# Patient Record
Sex: Female | Born: 1955 | Race: White | Hispanic: No | Marital: Married | Smoking: Never smoker
Health system: Southern US, Community
[De-identification: ages and names within clinical notes are randomized; demographics above are authoritative.]

## PROBLEM LIST (undated history)

## (undated) DIAGNOSIS — N809 Endometriosis, unspecified: Secondary | ICD-10-CM

## (undated) DIAGNOSIS — D649 Anemia, unspecified: Secondary | ICD-10-CM

## (undated) DIAGNOSIS — A048 Other specified bacterial intestinal infections: Secondary | ICD-10-CM

## (undated) DIAGNOSIS — K219 Gastro-esophageal reflux disease without esophagitis: Secondary | ICD-10-CM

## (undated) DIAGNOSIS — M1990 Unspecified osteoarthritis, unspecified site: Secondary | ICD-10-CM

## (undated) DIAGNOSIS — Z8709 Personal history of other diseases of the respiratory system: Secondary | ICD-10-CM

## (undated) DIAGNOSIS — I10 Essential (primary) hypertension: Secondary | ICD-10-CM

## (undated) DIAGNOSIS — T8859XA Other complications of anesthesia, initial encounter: Secondary | ICD-10-CM

## (undated) DIAGNOSIS — Z8489 Family history of other specified conditions: Secondary | ICD-10-CM

## (undated) DIAGNOSIS — D219 Benign neoplasm of connective and other soft tissue, unspecified: Secondary | ICD-10-CM

## (undated) DIAGNOSIS — J385 Laryngeal spasm: Secondary | ICD-10-CM

## (undated) DIAGNOSIS — T4145XA Adverse effect of unspecified anesthetic, initial encounter: Secondary | ICD-10-CM

## (undated) DIAGNOSIS — I498 Other specified cardiac arrhythmias: Secondary | ICD-10-CM

## (undated) DIAGNOSIS — R000 Tachycardia, unspecified: Secondary | ICD-10-CM

## (undated) HISTORY — PX: COLONOSCOPY: SHX174

## (undated) HISTORY — PX: OTHER SURGICAL HISTORY: SHX169

## (undated) HISTORY — PX: TONSILLECTOMY: SUR1361

## (undated) HISTORY — PX: JOINT REPLACEMENT: SHX530

---

## 1998-01-25 HISTORY — PX: COLONOSCOPY: SHX174

## 1998-01-25 HISTORY — PX: LAPAROSCOPY: SHX197

## 1998-01-25 HISTORY — PX: DILATION AND CURETTAGE OF UTERUS: SHX78

## 1998-01-25 HISTORY — PX: ESOPHAGOGASTRODUODENOSCOPY ENDOSCOPY: SHX5814

## 1998-02-10 ENCOUNTER — Emergency Department (HOSPITAL_COMMUNITY): Admission: EM | Admit: 1998-02-10 | Discharge: 1998-02-10 | Payer: Self-pay

## 2002-03-20 ENCOUNTER — Encounter: Payer: Self-pay | Admitting: Obstetrics and Gynecology

## 2002-03-20 ENCOUNTER — Encounter: Admission: RE | Admit: 2002-03-20 | Discharge: 2002-03-20 | Payer: Self-pay | Admitting: Obstetrics and Gynecology

## 2004-07-21 ENCOUNTER — Encounter: Admission: RE | Admit: 2004-07-21 | Discharge: 2004-07-21 | Payer: Self-pay | Admitting: Obstetrics and Gynecology

## 2004-12-28 ENCOUNTER — Encounter: Admission: RE | Admit: 2004-12-28 | Discharge: 2004-12-28 | Payer: Self-pay | Admitting: Obstetrics and Gynecology

## 2005-09-20 ENCOUNTER — Encounter: Admission: RE | Admit: 2005-09-20 | Discharge: 2005-09-20 | Payer: Self-pay | Admitting: *Deleted

## 2006-10-31 ENCOUNTER — Encounter: Admission: RE | Admit: 2006-10-31 | Discharge: 2006-10-31 | Payer: Self-pay | Admitting: *Deleted

## 2008-01-26 DIAGNOSIS — J385 Laryngeal spasm: Secondary | ICD-10-CM

## 2008-01-26 DIAGNOSIS — D649 Anemia, unspecified: Secondary | ICD-10-CM

## 2008-01-26 HISTORY — DX: Laryngeal spasm: J38.5

## 2008-01-26 HISTORY — DX: Anemia, unspecified: D64.9

## 2008-03-15 ENCOUNTER — Encounter: Admission: RE | Admit: 2008-03-15 | Discharge: 2008-03-15 | Payer: Self-pay | Admitting: Unknown Physician Specialty

## 2009-04-04 ENCOUNTER — Encounter: Admission: RE | Admit: 2009-04-04 | Discharge: 2009-04-04 | Payer: Self-pay | Admitting: Family Medicine

## 2010-07-23 ENCOUNTER — Other Ambulatory Visit: Payer: Self-pay | Admitting: Family Medicine

## 2010-07-23 ENCOUNTER — Ambulatory Visit
Admission: RE | Admit: 2010-07-23 | Discharge: 2010-07-23 | Disposition: A | Discharge status: Home / Self Care | Payer: PRIVATE HEALTH INSURANCE | Source: Ambulatory Visit | Attending: Family Medicine | Admitting: Family Medicine

## 2010-07-23 DIAGNOSIS — Z1231 Encounter for screening mammogram for malignant neoplasm of breast: Secondary | ICD-10-CM

## 2011-01-26 HISTORY — PX: BACK SURGERY: SHX140

## 2012-04-28 ENCOUNTER — Other Ambulatory Visit: Payer: Self-pay

## 2012-04-28 DIAGNOSIS — Z1231 Encounter for screening mammogram for malignant neoplasm of breast: Secondary | ICD-10-CM

## 2012-05-24 ENCOUNTER — Ambulatory Visit
Admission: RE | Admit: 2012-05-24 | Discharge: 2012-05-24 | Disposition: A | Discharge status: Home / Self Care | Payer: PRIVATE HEALTH INSURANCE | Source: Ambulatory Visit

## 2012-05-24 DIAGNOSIS — Z1231 Encounter for screening mammogram for malignant neoplasm of breast: Secondary | ICD-10-CM

## 2012-05-26 ENCOUNTER — Ambulatory Visit: Payer: PRIVATE HEALTH INSURANCE

## 2012-07-03 ENCOUNTER — Encounter (INDEPENDENT_AMBULATORY_CARE_PROVIDER_SITE_OTHER): Payer: Self-pay | Admitting: *Deleted

## 2013-07-06 ENCOUNTER — Other Ambulatory Visit: Payer: Self-pay

## 2013-07-06 DIAGNOSIS — Z1231 Encounter for screening mammogram for malignant neoplasm of breast: Secondary | ICD-10-CM

## 2013-07-20 ENCOUNTER — Ambulatory Visit
Admission: RE | Admit: 2013-07-20 | Discharge: 2013-07-20 | Disposition: A | Discharge status: Home / Self Care | Payer: PRIVATE HEALTH INSURANCE | Source: Ambulatory Visit

## 2013-07-20 ENCOUNTER — Encounter (INDEPENDENT_AMBULATORY_CARE_PROVIDER_SITE_OTHER): Payer: Self-pay

## 2013-07-20 DIAGNOSIS — Z1231 Encounter for screening mammogram for malignant neoplasm of breast: Secondary | ICD-10-CM

## 2013-07-23 ENCOUNTER — Other Ambulatory Visit: Payer: Self-pay | Admitting: Family Medicine

## 2013-07-23 DIAGNOSIS — R928 Other abnormal and inconclusive findings on diagnostic imaging of breast: Secondary | ICD-10-CM

## 2013-07-24 ENCOUNTER — Other Ambulatory Visit: Payer: Self-pay | Admitting: Family Medicine

## 2013-07-24 DIAGNOSIS — R928 Other abnormal and inconclusive findings on diagnostic imaging of breast: Secondary | ICD-10-CM

## 2013-07-25 HISTORY — PX: BREAST BIOPSY: SHX20

## 2013-07-26 ENCOUNTER — Ambulatory Visit
Admission: RE | Admit: 2013-07-26 | Discharge: 2013-07-26 | Disposition: A | Discharge status: Home / Self Care | Payer: PRIVATE HEALTH INSURANCE | Source: Ambulatory Visit | Attending: Family Medicine | Admitting: Family Medicine

## 2013-07-26 ENCOUNTER — Other Ambulatory Visit: Payer: Self-pay | Admitting: Family Medicine

## 2013-07-26 DIAGNOSIS — N6002 Solitary cyst of left breast: Secondary | ICD-10-CM

## 2013-07-26 DIAGNOSIS — R928 Other abnormal and inconclusive findings on diagnostic imaging of breast: Secondary | ICD-10-CM

## 2013-07-31 ENCOUNTER — Ambulatory Visit
Admission: RE | Admit: 2013-07-31 | Discharge: 2013-07-31 | Disposition: A | Discharge status: Home / Self Care | Payer: PRIVATE HEALTH INSURANCE | Source: Ambulatory Visit | Attending: Family Medicine | Admitting: Family Medicine

## 2013-07-31 ENCOUNTER — Other Ambulatory Visit: Payer: Self-pay | Admitting: Family Medicine

## 2013-07-31 DIAGNOSIS — N6002 Solitary cyst of left breast: Secondary | ICD-10-CM

## 2013-09-27 ENCOUNTER — Other Ambulatory Visit: Payer: Self-pay | Admitting: Orthopedic Surgery

## 2013-09-27 NOTE — Progress Notes (Signed)
Preoperative surgical orders have been place into the [facility] hospital system for Janann August on 09/27/2013, 12:50 PM  by Mickel Crow for surgery on 10/24/2013.  Preop Total Hip - Anterior Approach orders including Experel Injecion, IV Tylenol, and IV Decadron as long as there are no contraindications to the above medications. Arlee Muslim, PA-C

## 2013-10-16 ENCOUNTER — Encounter (HOSPITAL_COMMUNITY): Payer: Self-pay | Admitting: Pharmacy Technician

## 2013-10-17 ENCOUNTER — Other Ambulatory Visit: Payer: Self-pay | Admitting: Surgical

## 2013-10-17 NOTE — H&P (Signed)
TOTAL HIP ADMISSION H&P  Patient is admitted for left total hip arthroplasty.  Subjective:  Chief Complaint: left hip pain  HPI: Barbara Kirby, 58 y.o. female, has a history of pain and functional disability in the left hip(s) due to arthritis and patient has failed non-surgical conservative treatments for greater than 12 weeks to include NSAID's and/or analgesics, flexibility and strengthening excercises and activity modification.  Onset of symptoms was gradual starting 3 years ago with gradually worsening course since that time.The patient noted no past surgery on the left hip(s).  Patient currently rates pain in the left hip at 9 out of 10 with activity. Patient has night pain, worsening of pain with activity and weight bearing, pain that interfers with activities of daily living, pain with passive range of motion and crepitus. Patient has evidence of subchondral cysts, periarticular osteophytes and joint space narrowing by imaging studies. This condition presents safety issues increasing the risk of falls.  There is no current active infection.  Past Medical History Vertigo Bronchitis GERD Palpitations Degenerative Disc Disease Mumps (childhood) Menopause Osteoarthritis Chronic Pain Hypertension  Past Surgical History Dilation and Curettage of Uterus - Multiple Spinal Fusion. lower back Spinal Surgery Tonsillectomy  Current outpatient prescriptions: acetaminophen (TYLENOL) 325 MG tablet, Take 650 mg by mouth once as needed for moderate pain., Disp: , Rfl: ;   amoxicillin (AMOXIL) 500 MG capsule, Take 500 mg by mouth 4 (four) times daily., Disp: , Rfl: ;   aspirin EC 81 MG tablet, Take 81 mg by mouth at bedtime., Disp: , Rfl: ;   clarithromycin (BIAXIN) 500 MG tablet, Take 500 mg by mouth 2 (two) times daily., Disp: , Rfl:  escitalopram (LEXAPRO) 10 MG tablet, Take 5 mg by mouth at bedtime., Disp: , Rfl: ;   esomeprazole (NEXIUM) 20 MG capsule, Take 20 mg by mouth at  bedtime., Disp: , Rfl: ;   gabapentin (NEURONTIN) 300 MG capsule, Take 300 mg by mouth at bedtime., Disp: , Rfl: ;   metoprolol succinate (TOPROL-XL) 50 MG 24 hr tablet, Take 50 mg by mouth at bedtime. Take with or immediately following a meal., Disp: , Rfl:  Naproxen-Esomeprazole (VIMOVO PO), Take 1 tablet by mouth daily., Disp: , Rfl: ;   triamterene-hydrochlorothiazide (MAXZIDE-25) 37.5-25 MG per tablet, Take 0.5 tablets by mouth at bedtime., Disp: , Rfl:   Allergies  Allergen Reactions  . Augmentin [Amoxicillin-Pot Clavulanate] Nausea And Vomiting  . Codeine Nausea Only    History  Substance Use Topics  . Smoking status: None smoker  . Smokeless tobacco: No  . Alcohol Use: None      Review of Systems  Constitutional: Positive for diaphoresis. Negative for fever, chills, weight loss and malaise/fatigue.  HENT: Negative.   Eyes: Negative.   Respiratory: Negative.   Cardiovascular: Positive for palpitations. Negative for chest pain, orthopnea, claudication, leg swelling and PND.  Gastrointestinal: Negative.   Genitourinary: Negative.   Musculoskeletal: Positive for back pain and joint pain. Negative for falls, myalgias and neck pain.       Left hip pain  Skin: Negative.   Neurological: Negative.  Negative for weakness.  Endo/Heme/Allergies: Negative.   Psychiatric/Behavioral: Negative.     Objective:  Physical Exam  Constitutional: She is oriented to person, place, and time. She appears well-developed. No distress.  Obese  HENT:  Head: Normocephalic and atraumatic.  Right Ear: External ear normal.  Left Ear: External ear normal.  Nose: Nose normal.  Mouth/Throat: Oropharynx is clear and moist.  Eyes: Conjunctivae  and EOM are normal.  Neck: Normal range of motion. Neck supple.  Cardiovascular: Normal rate, regular rhythm, normal heart sounds and intact distal pulses.   No murmur heard. Respiratory: Effort normal and breath sounds normal. No respiratory distress. She  has no wheezes.  GI: Soft. Bowel sounds are normal. She exhibits no distension. There is no tenderness.  Musculoskeletal:       Right hip: Normal.       Left hip: She exhibits decreased range of motion, decreased strength and crepitus.       Right knee: Normal.       Left knee: Normal.       Right lower leg: She exhibits no tenderness and no swelling.       Left lower leg: She exhibits no tenderness and no swelling.  Her left hip can be flexed to approximately 90 with no internal rotation, about 10-20 of external rotation and about 20 abduction. Right hip has near normal range of motion without discomfort. She walks with a significantly antalgic gait pattern. She has pain on attempted provocative testing of the hip.  Neurological: She is alert and oriented to person, place, and time. She has normal strength and normal reflexes. No sensory deficit.  Skin: No rash noted. She is not diaphoretic. No erythema.  Psychiatric: She has a normal mood and affect. Her behavior is normal.   Vitals  Weight: 240 lb Height: 68in Body Surface Area: 2.29 m Body Mass Index: 36.49 kg/m Pulse: 72 (Regular)  BP: 148/82 (Sitting, Left Arm, Standard)  Imaging Review Plain radiographs demonstrate severe degenerative joint disease of the left hip(s). The bone quality appears to be good for age and reported activity level.  Assessment/Plan:  End stage arthritis, left hip(s)  The patient history, physical examination, clinical judgement of the provider and imaging studies are consistent with end stage degenerative joint disease of the left hip(s) and total hip arthroplasty is deemed medically necessary. The treatment options including medical management, injection therapy, arthroscopy and arthroplasty were discussed at length. The risks and benefits of total hip arthroplasty were presented and reviewed. The risks due to aseptic loosening, infection, stiffness, dislocation/subluxation,  thromboembolic  complications and other imponderables were discussed.  The patient acknowledged the explanation, agreed to proceed with the plan and consent was signed. Patient is being admitted for inpatient treatment for surgery, pain control, PT, OT, prophylactic antibiotics, VTE prophylaxis, progressive ambulation and ADL's and discharge planning.The patient is planning to be discharged home with home health services    TXA  WNI:OEVOJ Daniel Spine: Mark Roy     , [place]

## 2013-10-19 ENCOUNTER — Encounter (HOSPITAL_COMMUNITY): Payer: Self-pay

## 2013-10-19 ENCOUNTER — Encounter (INDEPENDENT_AMBULATORY_CARE_PROVIDER_SITE_OTHER): Payer: Self-pay

## 2013-10-19 ENCOUNTER — Ambulatory Visit (HOSPITAL_COMMUNITY)
Admission: RE | Admit: 2013-10-19 | Discharge: 2013-10-19 | Disposition: A | Discharge status: Home / Self Care | Payer: PRIVATE HEALTH INSURANCE | Source: Ambulatory Visit | Attending: Orthopedic Surgery | Admitting: Orthopedic Surgery

## 2013-10-19 ENCOUNTER — Encounter (HOSPITAL_COMMUNITY)
Admission: RE | Admit: 2013-10-19 | Discharge: 2013-10-19 | Disposition: A | Discharge status: Home / Self Care | Payer: PRIVATE HEALTH INSURANCE | Source: Ambulatory Visit | Attending: Orthopedic Surgery | Admitting: Orthopedic Surgery

## 2013-10-19 ENCOUNTER — Ambulatory Visit (HOSPITAL_COMMUNITY)
Admission: RE | Admit: 2013-10-19 | Discharge: 2013-10-19 | Disposition: A | Discharge status: Home / Self Care | Payer: PRIVATE HEALTH INSURANCE | Source: Ambulatory Visit | Attending: Anesthesiology | Admitting: Anesthesiology

## 2013-10-19 DIAGNOSIS — M1610 Unilateral primary osteoarthritis, unspecified hip: Secondary | ICD-10-CM | POA: Diagnosis present

## 2013-10-19 DIAGNOSIS — Z01818 Encounter for other preprocedural examination: Secondary | ICD-10-CM | POA: Diagnosis present

## 2013-10-19 DIAGNOSIS — M169 Osteoarthritis of hip, unspecified: Secondary | ICD-10-CM | POA: Diagnosis present

## 2013-10-19 HISTORY — DX: Gastro-esophageal reflux disease without esophagitis: K21.9

## 2013-10-19 HISTORY — DX: Unspecified osteoarthritis, unspecified site: M19.90

## 2013-10-19 HISTORY — DX: Other specified bacterial intestinal infections: A04.8

## 2013-10-19 HISTORY — DX: Adverse effect of unspecified anesthetic, initial encounter: T41.45XA

## 2013-10-19 HISTORY — DX: Other specified cardiac arrhythmias: I49.8

## 2013-10-19 HISTORY — DX: Essential (primary) hypertension: I10

## 2013-10-19 HISTORY — DX: Endometriosis, unspecified: N80.9

## 2013-10-19 HISTORY — DX: Other complications of anesthesia, initial encounter: T88.59XA

## 2013-10-19 HISTORY — DX: Tachycardia, unspecified: R00.0

## 2013-10-19 HISTORY — DX: Laryngeal spasm: J38.5

## 2013-10-19 HISTORY — DX: Benign neoplasm of connective and other soft tissue, unspecified: D21.9

## 2013-10-19 HISTORY — DX: Anemia, unspecified: D64.9

## 2013-10-19 HISTORY — DX: Personal history of other diseases of the respiratory system: Z87.09

## 2013-10-19 LAB — COMPREHENSIVE METABOLIC PANEL
ALBUMIN: 4 g/dL (ref 3.5–5.2)
ALT: 21 U/L (ref 0–35)
AST: 21 U/L (ref 0–37)
Alkaline Phosphatase: 48 U/L (ref 39–117)
Anion gap: 11 (ref 5–15)
BILIRUBIN TOTAL: 0.5 mg/dL (ref 0.3–1.2)
BUN: 15 mg/dL (ref 6–23)
CHLORIDE: 99 meq/L (ref 96–112)
CO2: 28 meq/L (ref 19–32)
Calcium: 9.2 mg/dL (ref 8.4–10.5)
Creatinine, Ser: 0.74 mg/dL (ref 0.50–1.10)
GFR calc Af Amer: >90 mL/min (ref >90)
Glucose, Bld: 101 mg/dL — ABNORMAL HIGH (ref 70–99)
POTASSIUM: 4.5 meq/L (ref 3.7–5.3)
Sodium: 138 mEq/L (ref 137–147)
Total Protein: 7.6 g/dL (ref 6.0–8.3)

## 2013-10-19 LAB — URINALYSIS, ROUTINE W REFLEX MICROSCOPIC
Bilirubin Urine: NEGATIVE
Glucose, UA: NEGATIVE mg/dL
Hgb urine dipstick: NEGATIVE
Ketones, ur: NEGATIVE mg/dL
Leukocytes, UA: NEGATIVE
NITRITE: NEGATIVE
PH: 6 (ref 5.0–8.0)
Protein, ur: NEGATIVE mg/dL
SPECIFIC GRAVITY, URINE: 1.025 (ref 1.005–1.030)
Urobilinogen, UA: 0.2 mg/dL (ref 0.0–1.0)

## 2013-10-19 LAB — CBC
HCT: 40 % (ref 36.0–46.0)
Hemoglobin: 14.2 g/dL (ref 12.0–15.0)
MCH: 32.6 pg (ref 26.0–34.0)
MCHC: 35.5 g/dL (ref 30.0–36.0)
MCV: 91.7 fL (ref 78.0–100.0)
PLATELETS: 223 10*3/uL (ref 150–400)
RBC: 4.36 MIL/uL (ref 3.87–5.11)
RDW: 12.7 % (ref 11.5–15.5)
WBC: 6.4 10*3/uL (ref 4.0–10.5)

## 2013-10-19 LAB — SURGICAL PCR SCREEN
MRSA, PCR: NEGATIVE
Staphylococcus aureus: NEGATIVE

## 2013-10-19 LAB — PROTIME-INR
INR: 1.03 (ref 0.00–1.49)
Prothrombin Time: 13.5 seconds (ref 11.6–15.2)

## 2013-10-19 LAB — APTT: APTT: 29 s (ref 24–37)

## 2013-10-19 LAB — ABO/RH: ABO/RH(D): A POS

## 2013-10-19 NOTE — Progress Notes (Signed)
Surgery clearance note 04/12/13 Dr. Quillian Quince on chart, serum lipase results 10/04/13 on chart, H. Pylori results 10/04/13 on chart

## 2013-10-19 NOTE — Patient Instructions (Signed)
21 Barbara Kirby  10/19/2013   Your procedure is scheduled on: 10/24/13 Wednesday  Report to [facility] at 12:15 PM.  Call this number if you have problems the morning of surgery [phone]: [phone]   Remember:   Do not eat food After Midnight, clear liquids from midnight until 08:15am on 10/24/13 then nothing.     Do not wear jewelry, make-up or nail polish.  Do not wear lotions, powders, or perfumes. You may wear deodorant.  Do not shave 48 hours prior to surgery. Men may shave face and neck.  Do not bring valuables to the hospital.  Contacts, dentures or bridgework may not be worn into surgery.  Leave suitcase in the car. After surgery it may be brought to your room.  For patients admitted to the hospital, checkout time is 11:00 AM the day of discharge.    Please read over the following fact sheets that you were given:MRSA Enlow, RN  pre op nurse call if needed [phone]    [facility] - Preparing for Surgery Before surgery, you can play an important role.  Because skin is not sterile, your skin needs to be as free of germs as possible.  You can reduce the number of germs on your skin by washing with CHG (chlorahexidine gluconate) soap before surgery.  CHG is an antiseptic cleaner which kills germs and bonds with the skin to continue killing germs even after washing. Please DO NOT use if you have an allergy to CHG or antibacterial soaps.  If your skin becomes reddened/irritated stop using the CHG and inform your nurse when you arrive at Short Stay. Do not shave (including legs and underarms) for at least 48 hours prior to the first CHG shower.  You may shave your face/neck. Please follow these instructions carefully:  1.  Shower with CHG Soap the night before surgery and the  morning of Surgery.  2.  If you choose to wash your hair, wash your hair first as usual with your  normal  shampoo.  3.  After you shampoo, rinse your hair and body  thoroughly to remove the  shampoo.                            4.  Use CHG as you would any other liquid soap.  You can apply chg directly  to the skin and wash                       Gently with a scrungie or clean washcloth.  5.  Apply the CHG Soap to your body ONLY FROM THE NECK DOWN.   Do not use on face/ open                           Wound or open sores. Avoid contact with eyes, ears mouth and genitals (private parts).                       Wash face,  Genitals (private parts) with your normal soap.             6.  Wash thoroughly, paying special attention to the area where your surgery  will be performed.  7.  Thoroughly rinse your body with warm water from the neck down.  8.  DO NOT shower/wash with your normal soap after  using and rinsing off  the CHG Soap.                9.  Pat yourself dry with a clean towel.            10.  Wear clean pajamas.            11.  Place clean sheets on your bed the night of your first shower and do not  sleep with pets. Day of Surgery : Do not apply any lotions the morning of surgery.  Please wear clean clothes to the hospital/surgery center.  FAILURE TO FOLLOW THESE INSTRUCTIONS MAY RESULT IN THE CANCELLATION OF YOUR SURGERY PATIENT SIGNATURE_________________________________  NURSE SIGNATURE__________________________________  ________________________________________________________________________  WHAT IS A BLOOD TRANSFUSION? Blood Transfusion Information  A transfusion is the replacement of blood or some of its parts. Blood is made up of multiple cells which provide different functions.  Red blood cells carry oxygen and are used for blood loss replacement.  White blood cells fight against infection.  Platelets control bleeding.  Plasma helps clot blood.  Other blood products are available for specialized needs, such as hemophilia or other clotting disorders. BEFORE THE TRANSFUSION  Who gives blood for transfusions?   Healthy volunteers  who are fully evaluated to make sure their blood is safe. This is blood bank blood. Transfusion therapy is the safest it has ever been in the practice of medicine. Before blood is taken from a donor, a complete history is taken to make sure that person has no history of diseases nor engages in risky social behavior (examples are intravenous drug use or sexual activity with multiple partners). The donor's travel history is screened to minimize risk of transmitting infections, such as malaria. The donated blood is tested for signs of infectious diseases, such as HIV and hepatitis. The blood is then tested to be sure it is compatible with you in order to minimize the chance of a transfusion reaction. If you or a relative donates blood, this is often done in anticipation of surgery and is not appropriate for emergency situations. It takes many days to process the donated blood. RISKS AND COMPLICATIONS Although transfusion therapy is very safe and saves many lives, the main dangers of transfusion include:   Getting an infectious disease.  Developing a transfusion reaction. This is an allergic reaction to something in the blood you were given. Every precaution is taken to prevent this. The decision to have a blood transfusion has been considered carefully by your caregiver before blood is given. Blood is not given unless the benefits outweigh the risks. AFTER THE TRANSFUSION  Right after receiving a blood transfusion, you will usually feel much better and more energetic. This is especially true if your red blood cells have gotten low (anemic). The transfusion raises the level of the red blood cells which carry oxygen, and this usually causes an energy increase.  The nurse administering the transfusion will monitor you carefully for complications. HOME CARE INSTRUCTIONS  No special instructions are needed after a transfusion. You may find your energy is better. Speak with your caregiver about any limitations  on activity for underlying diseases you may have. SEEK MEDICAL CARE IF:   Your condition is not improving after your transfusion.  You develop redness or irritation at the intravenous (IV) site. SEEK IMMEDIATE MEDICAL CARE IF:  Any of the following symptoms occur over the next 12 hours:  Shaking chills.  You have a temperature by mouth above 102 F (  38.9 C), not controlled by medicine.  Chest, back, or muscle pain.  People around you feel you are not acting correctly or are confused.  Shortness of breath or difficulty breathing.  Dizziness and fainting.  You get a rash or develop hives.  You have a decrease in urine output.  Your urine turns a dark color or changes to pink, red, or brown. Any of the following symptoms occur over the next 10 days:  You have a temperature by mouth above 102 F (38.9 C), not controlled by medicine.  Shortness of breath.  Weakness after normal activity.  The white part of the eye turns yellow (jaundice).  You have a decrease in the amount of urine or are urinating less often.  Your urine turns a dark color or changes to pink, red, or brown. Document Released: 01/09/2000 Document Revised: 04/05/2011 Document Reviewed: 08/28/2007 [employer] Patient Information 2014 [employer], [place].  _______________________________________________________________________   CLEAR LIQUID DIET   Foods Allowed                                                                     Foods Excluded  Coffee and tea, regular and decaf                             liquids that you cannot  Plain Jell-O in any flavor                                             see through such as: Fruit ices (not with fruit pulp)                                     milk, soups, orange juice  Iced Popsicles                                    All solid food Carbonated beverages, regular and diet                                    Cranberry, grape and apple juices Sports drinks like  Gatorade Lightly seasoned clear broth or consume(fat free) Sugar, honey syrup  Sample Menu Breakfast                                Lunch                                     Supper Cranberry juice                    Beef broth  Chicken broth Jell-O                                     Grape juice                           Apple juice Coffee or tea                        Jell-O                                      Popsicle                                                Coffee or tea                        Coffee or tea  _____________________________________________________________________    Incentive Spirometer  An incentive spirometer is a tool that can help keep your lungs clear and active. This tool measures how well you are filling your lungs with each breath. Taking long deep breaths may help reverse or decrease the chance of developing breathing (pulmonary) problems (especially infection) following:  A long period of time when you are unable to move or be active. BEFORE THE PROCEDURE   If the spirometer includes an indicator to show your best effort, your nurse or respiratory therapist will set it to a desired goal.  If possible, sit up straight or lean slightly forward. Try not to slouch.  Hold the incentive spirometer in an upright position. INSTRUCTIONS FOR USE  1. Sit on the edge of your bed if possible, or sit up as far as you can in bed or on a chair. 2. Hold the incentive spirometer in an upright position. 3. Breathe out normally. 4. Place the mouthpiece in your mouth and seal your lips tightly around it. 5. Breathe in slowly and as deeply as possible, raising the piston or the ball toward the top of the column. 6. Hold your breath for 3-5 seconds or for as long as possible. Allow the piston or ball to fall to the bottom of the column. 7. Remove the mouthpiece from your mouth and breathe out normally. 8. Rest for a few seconds and repeat Steps  1 through 7 at least 10 times every 1-2 hours when you are awake. Take your time and take a few normal breaths between deep breaths. 9. The spirometer may include an indicator to show your best effort. Use the indicator as a goal to work toward during each repetition. 10. After each set of 10 deep breaths, practice coughing to be sure your lungs are clear. If you have an incision (the cut made at the time of surgery), support your incision when coughing by placing a pillow or rolled up towels firmly against it. Once you are able to get out of bed, walk around indoors and cough well. You may stop using the incentive spirometer when instructed by your caregiver.  RISKS AND COMPLICATIONS  Take your time so you do not get dizzy or light-headed.  If you are in pain, you  may need to take or ask for pain medication before doing incentive spirometry. It is harder to take a deep breath if you are having pain. AFTER USE  Rest and breathe slowly and easily.  It can be helpful to keep track of a log of your progress. Your caregiver can provide you with a simple table to help with this. If you are using the spirometer at home, follow these instructions: [place] IF:   You are having difficultly using the spirometer.  You have trouble using the spirometer as often as instructed.  Your pain medication is not giving enough relief while using the spirometer.  You develop fever of 100.5 F (38.1 C) or higher. SEEK IMMEDIATE MEDICAL CARE IF:   You cough up bloody sputum that had not been present before.  You develop fever of 102 F (38.9 C) or greater.  You develop worsening pain at or near the incision site. MAKE SURE YOU:   Understand these instructions.  Will watch your condition.  Will get help right away if you are not doing well or get worse. Document Released: 05/24/2006 Document Revised: 04/05/2011 Document Reviewed: 07/25/2006 [facility] Patient Information 2014 [place],  [place].   ________________________________________________________________________

## 2013-10-19 NOTE — Progress Notes (Signed)
10/19/13 0927  OBSTRUCTIVE SLEEP APNEA  Have you ever been diagnosed with sleep apnea through a sleep study? No  Do you snore loudly (loud enough to be heard through closed doors)?  0  Do you often feel tired, fatigued, or sleepy during the daytime? 0  Has anyone observed you stop breathing during your sleep? 0  Do you have, or are you being treated for high blood pressure? 1  BMI more than 35 kg/m2? 1  Age over 57 years old? 1  Neck circumference greater than 40 cm/16 inches? 1  Gender: 0  Obstructive Sleep Apnea Score 4  Score 4 or greater  Results sent to PCP

## 2013-10-22 NOTE — Progress Notes (Signed)
EKG reviewed by Dr. Kalman Shan and Dr. Marcell Barlow - no action required - also reviewed by Dr. Landry Dyke

## 2013-10-24 ENCOUNTER — Inpatient Hospital Stay (HOSPITAL_COMMUNITY): Payer: PRIVATE HEALTH INSURANCE

## 2013-10-24 ENCOUNTER — Inpatient Hospital Stay (HOSPITAL_COMMUNITY): Payer: PRIVATE HEALTH INSURANCE | Admitting: Anesthesiology

## 2013-10-24 ENCOUNTER — Encounter (HOSPITAL_COMMUNITY): Payer: Self-pay | Admitting: *Deleted

## 2013-10-24 ENCOUNTER — Encounter (HOSPITAL_COMMUNITY): Payer: PRIVATE HEALTH INSURANCE | Admitting: Anesthesiology

## 2013-10-24 ENCOUNTER — Inpatient Hospital Stay (HOSPITAL_COMMUNITY)
Admission: RE | Admit: 2013-10-24 | Discharge: 2013-10-26 | DRG: 470 | Disposition: A | Discharge status: Home Health | Payer: PRIVATE HEALTH INSURANCE | Source: Ambulatory Visit | Attending: Orthopedic Surgery | Admitting: Orthopedic Surgery

## 2013-10-24 ENCOUNTER — Encounter (HOSPITAL_COMMUNITY)
Admission: RE | Disposition: A | Discharge status: Home Health | Payer: Self-pay | Source: Ambulatory Visit | Attending: Orthopedic Surgery

## 2013-10-24 DIAGNOSIS — Z791 Long term (current) use of non-steroidal anti-inflammatories (NSAID): Secondary | ICD-10-CM | POA: Diagnosis not present

## 2013-10-24 DIAGNOSIS — Z7982 Long term (current) use of aspirin: Secondary | ICD-10-CM | POA: Diagnosis not present

## 2013-10-24 DIAGNOSIS — Z981 Arthrodesis status: Secondary | ICD-10-CM

## 2013-10-24 DIAGNOSIS — I10 Essential (primary) hypertension: Secondary | ICD-10-CM | POA: Diagnosis present

## 2013-10-24 DIAGNOSIS — Z881 Allergy status to other antibiotic agents status: Secondary | ICD-10-CM

## 2013-10-24 DIAGNOSIS — Z792 Long term (current) use of antibiotics: Secondary | ICD-10-CM

## 2013-10-24 DIAGNOSIS — G8929 Other chronic pain: Secondary | ICD-10-CM | POA: Diagnosis present

## 2013-10-24 DIAGNOSIS — Z6836 Body mass index (BMI) 36.0-36.9, adult: Secondary | ICD-10-CM

## 2013-10-24 DIAGNOSIS — E669 Obesity, unspecified: Secondary | ICD-10-CM | POA: Diagnosis present

## 2013-10-24 DIAGNOSIS — M1612 Unilateral primary osteoarthritis, left hip: Principal | ICD-10-CM | POA: Diagnosis present

## 2013-10-24 DIAGNOSIS — K219 Gastro-esophageal reflux disease without esophagitis: Secondary | ICD-10-CM | POA: Diagnosis present

## 2013-10-24 DIAGNOSIS — M169 Osteoarthritis of hip, unspecified: Secondary | ICD-10-CM | POA: Diagnosis present

## 2013-10-24 DIAGNOSIS — Z885 Allergy status to narcotic agent status: Secondary | ICD-10-CM

## 2013-10-24 DIAGNOSIS — Z79899 Other long term (current) drug therapy: Secondary | ICD-10-CM

## 2013-10-24 DIAGNOSIS — M25552 Pain in left hip: Secondary | ICD-10-CM | POA: Diagnosis present

## 2013-10-24 HISTORY — PX: TOTAL HIP ARTHROPLASTY: SHX124

## 2013-10-24 LAB — TYPE AND SCREEN
ABO/RH(D): A POS
Antibody Screen: NEGATIVE

## 2013-10-24 SURGERY — ARTHROPLASTY, HIP, TOTAL, ANTERIOR APPROACH
Anesthesia: Spinal | Site: Hip | Laterality: Left

## 2013-10-24 MED ORDER — ACETAMINOPHEN 10 MG/ML IV SOLN
1000.0000 mg | Freq: Once | INTRAVENOUS | Status: AC
  Administered 2013-10-24: 1000 mg via INTRAVENOUS
  Filled 2013-10-24: qty 100

## 2013-10-24 MED ORDER — METHOCARBAMOL 1000 MG/10ML IJ SOLN
500.0000 mg | Freq: Four times a day (QID) | INTRAVENOUS | Status: DC | PRN
  Administered 2013-10-24: 500 mg via INTRAVENOUS
  Filled 2013-10-24: qty 5

## 2013-10-24 MED ORDER — DEXAMETHASONE SODIUM PHOSPHATE 10 MG/ML IJ SOLN
10.0000 mg | Freq: Once | INTRAMUSCULAR | Status: DC
Start: 2013-10-24 — End: 2013-10-24

## 2013-10-24 MED ORDER — FENTANYL CITRATE 0.05 MG/ML IJ SOLN
INTRAMUSCULAR | Status: DC | PRN
  Administered 2013-10-24: 50 ug via INTRAVENOUS
  Administered 2013-10-24: 100 ug via INTRAVENOUS
  Administered 2013-10-24: 50 ug via INTRAVENOUS

## 2013-10-24 MED ORDER — CEFAZOLIN SODIUM-DEXTROSE 2-3 GM-% IV SOLR
2.0000 g | Freq: Four times a day (QID) | INTRAVENOUS | Status: AC
  Administered 2013-10-24 – 2013-10-25 (×2): 2 g via INTRAVENOUS
  Filled 2013-10-24 (×2): qty 50

## 2013-10-24 MED ORDER — FLEET ENEMA 7-19 GM/118ML RE ENEM: 1.0000 | ENEMA | Freq: Once | RECTAL | Status: AC | PRN

## 2013-10-24 MED ORDER — ACETAMINOPHEN 500 MG PO TABS
1000.0000 mg | ORAL_TABLET | Freq: Four times a day (QID) | ORAL | Status: AC
  Administered 2013-10-24 – 2013-10-25 (×3): 1000 mg via ORAL
  Filled 2013-10-24 (×5): qty 2

## 2013-10-24 MED ORDER — RIVAROXABAN 10 MG PO TABS
10.0000 mg | ORAL_TABLET | Freq: Every day | ORAL | Status: DC
  Administered 2013-10-25 – 2013-10-26 (×2): 10 mg via ORAL
  Filled 2013-10-24 (×3): qty 1

## 2013-10-24 MED ORDER — ONDANSETRON HCL 4 MG/2ML IJ SOLN
INTRAMUSCULAR | Status: AC
  Filled 2013-10-24: qty 2

## 2013-10-24 MED ORDER — MIDAZOLAM HCL 2 MG/2ML IJ SOLN
INTRAMUSCULAR | Status: AC
  Filled 2013-10-24: qty 2

## 2013-10-24 MED ORDER — FENTANYL CITRATE 0.05 MG/ML IJ SOLN
INTRAMUSCULAR | Status: AC
  Filled 2013-10-24: qty 2

## 2013-10-24 MED ORDER — TRANEXAMIC ACID 100 MG/ML IV SOLN
2000.0000 mg | Freq: Once | INTRAVENOUS | Status: DC
  Filled 2013-10-24: qty 20

## 2013-10-24 MED ORDER — KETOROLAC TROMETHAMINE 15 MG/ML IJ SOLN
7.5000 mg | Freq: Four times a day (QID) | INTRAMUSCULAR | Status: AC | PRN
  Administered 2013-10-24: 7.5 mg via INTRAVENOUS
  Filled 2013-10-24: qty 1

## 2013-10-24 MED ORDER — HYDROMORPHONE HCL 2 MG PO TABS
2.0000 mg | ORAL_TABLET | ORAL | Status: DC | PRN
  Administered 2013-10-24 – 2013-10-25 (×2): 2 mg via ORAL
  Administered 2013-10-25 (×2): 4 mg via ORAL
  Administered 2013-10-26: 2 mg via ORAL
  Administered 2013-10-26 (×2): 4 mg via ORAL
  Filled 2013-10-24: qty 2
  Filled 2013-10-24: qty 1
  Filled 2013-10-24: qty 2
  Filled 2013-10-24: qty 1
  Filled 2013-10-24: qty 2
  Filled 2013-10-24: qty 1
  Filled 2013-10-24: qty 2

## 2013-10-24 MED ORDER — DIPHENHYDRAMINE HCL 12.5 MG/5ML PO ELIX: 12.5000 mg | ORAL_SOLUTION | ORAL | Status: DC | PRN

## 2013-10-24 MED ORDER — PHENYLEPHRINE HCL 10 MG/ML IJ SOLN
INTRAMUSCULAR | Status: AC
  Filled 2013-10-24: qty 1

## 2013-10-24 MED ORDER — ONDANSETRON HCL 4 MG/2ML IJ SOLN
INTRAMUSCULAR | Status: DC | PRN
Start: 2013-10-24 — End: 2013-10-24
  Administered 2013-10-24 (×2): 2 mg via INTRAVENOUS

## 2013-10-24 MED ORDER — DEXAMETHASONE SODIUM PHOSPHATE 10 MG/ML IJ SOLN
10.0000 mg | Freq: Once | INTRAMUSCULAR | Status: AC
  Administered 2013-10-25: 10 mg via INTRAVENOUS
  Filled 2013-10-24: qty 1

## 2013-10-24 MED ORDER — BUPIVACAINE HCL (PF) 0.25 % IJ SOLN
INTRAMUSCULAR | Status: AC
  Filled 2013-10-24: qty 30

## 2013-10-24 MED ORDER — TRIAMTERENE-HCTZ 37.5-25 MG PO TABS
0.5000 | ORAL_TABLET | Freq: Every day | ORAL | Status: DC
  Administered 2013-10-24 – 2013-10-25 (×2): 0.5 via ORAL
  Filled 2013-10-24 (×4): qty 0.5

## 2013-10-24 MED ORDER — METOPROLOL SUCCINATE ER 50 MG PO TB24
50.0000 mg | ORAL_TABLET | Freq: Every day | ORAL | Status: DC
  Administered 2013-10-24 – 2013-10-25 (×2): 50 mg via ORAL
  Filled 2013-10-24 (×3): qty 1

## 2013-10-24 MED ORDER — STERILE WATER FOR IRRIGATION IR SOLN
Status: DC | PRN
  Administered 2013-10-24: 1500 mL

## 2013-10-24 MED ORDER — PROPOFOL 10 MG/ML IV BOLUS
INTRAVENOUS | Status: AC
Start: 2013-10-24 — End: 2013-10-24
  Filled 2013-10-24: qty 20

## 2013-10-24 MED ORDER — MENTHOL 3 MG MT LOZG
1.0000 | LOZENGE | OROMUCOSAL | Status: DC | PRN
  Filled 2013-10-24: qty 9

## 2013-10-24 MED ORDER — ONDANSETRON HCL 4 MG PO TABS: 4.0000 mg | ORAL_TABLET | Freq: Four times a day (QID) | ORAL | Status: DC | PRN

## 2013-10-24 MED ORDER — ACETAMINOPHEN 325 MG PO TABS: 650.0000 mg | ORAL_TABLET | Freq: Four times a day (QID) | ORAL | Status: DC | PRN

## 2013-10-24 MED ORDER — PROPOFOL 10 MG/ML IV BOLUS
INTRAVENOUS | Status: AC
  Filled 2013-10-24: qty 20

## 2013-10-24 MED ORDER — BUPIVACAINE LIPOSOME 1.3 % IJ SUSP
20.0000 mL | Freq: Once | INTRAMUSCULAR | Status: DC
  Filled 2013-10-24: qty 20

## 2013-10-24 MED ORDER — SODIUM CHLORIDE 0.9 % IV SOLN
1000.0000 mg | INTRAVENOUS | Status: DC
  Filled 2013-10-24: qty 10

## 2013-10-24 MED ORDER — PANTOPRAZOLE SODIUM 40 MG PO TBEC
40.0000 mg | DELAYED_RELEASE_TABLET | Freq: Every day | ORAL | Status: DC
  Administered 2013-10-24: 40 mg via ORAL
  Filled 2013-10-24 (×2): qty 1

## 2013-10-24 MED ORDER — DOCUSATE SODIUM 100 MG PO CAPS
100.0000 mg | ORAL_CAPSULE | Freq: Two times a day (BID) | ORAL | Status: DC
  Administered 2013-10-24 – 2013-10-26 (×4): 100 mg via ORAL

## 2013-10-24 MED ORDER — MIDAZOLAM HCL 5 MG/5ML IJ SOLN
INTRAMUSCULAR | Status: DC | PRN
  Administered 2013-10-24 (×4): 1 mg via INTRAVENOUS

## 2013-10-24 MED ORDER — SODIUM CHLORIDE 0.9 % IJ SOLN
INTRAMUSCULAR | Status: AC
  Filled 2013-10-24: qty 50

## 2013-10-24 MED ORDER — TRIAMTERENE-HCTZ 37.5-25 MG PO TABS: 0.5000 | ORAL_TABLET | Freq: Every day | ORAL | Status: DC

## 2013-10-24 MED ORDER — ONDANSETRON HCL 4 MG/2ML IJ SOLN: 4.0000 mg | Freq: Four times a day (QID) | INTRAMUSCULAR | Status: DC | PRN

## 2013-10-24 MED ORDER — BUPIVACAINE IN DEXTROSE 0.75-8.25 % IT SOLN
INTRATHECAL | Status: DC | PRN
  Administered 2013-10-24: 2 mL via INTRATHECAL

## 2013-10-24 MED ORDER — 0.9 % SODIUM CHLORIDE (POUR BTL) OPTIME
TOPICAL | Status: DC | PRN
  Administered 2013-10-24: 1000 mL

## 2013-10-24 MED ORDER — DEXAMETHASONE SODIUM PHOSPHATE 10 MG/ML IJ SOLN
INTRAMUSCULAR | Status: AC
  Filled 2013-10-24: qty 1

## 2013-10-24 MED ORDER — ACETAMINOPHEN 650 MG RE SUPP: 650.0000 mg | Freq: Four times a day (QID) | RECTAL | Status: DC | PRN

## 2013-10-24 MED ORDER — SODIUM CHLORIDE 0.9 % IV SOLN: INTRAVENOUS | Status: DC

## 2013-10-24 MED ORDER — METHOCARBAMOL 500 MG PO TABS
500.0000 mg | ORAL_TABLET | Freq: Four times a day (QID) | ORAL | Status: DC | PRN
  Administered 2013-10-25 – 2013-10-26 (×2): 500 mg via ORAL
  Filled 2013-10-24 (×2): qty 1

## 2013-10-24 MED ORDER — CEFAZOLIN SODIUM-DEXTROSE 2-3 GM-% IV SOLR
INTRAVENOUS | Status: AC
  Filled 2013-10-24: qty 50

## 2013-10-24 MED ORDER — ESCITALOPRAM OXALATE 5 MG PO TABS
5.0000 mg | ORAL_TABLET | Freq: Every day | ORAL | Status: DC
  Administered 2013-10-24 – 2013-10-25 (×2): 5 mg via ORAL
  Filled 2013-10-24 (×3): qty 1

## 2013-10-24 MED ORDER — SODIUM CHLORIDE 0.9 % IV SOLN
10.0000 mg | INTRAVENOUS | Status: DC | PRN
  Administered 2013-10-24: 20 ug/min via INTRAVENOUS

## 2013-10-24 MED ORDER — METOCLOPRAMIDE HCL 10 MG PO TABS: 5.0000 mg | ORAL_TABLET | Freq: Three times a day (TID) | ORAL | Status: DC | PRN

## 2013-10-24 MED ORDER — POLYETHYLENE GLYCOL 3350 17 G PO PACK: 17.0000 g | PACK | Freq: Every day | ORAL | Status: DC | PRN

## 2013-10-24 MED ORDER — CEFAZOLIN SODIUM-DEXTROSE 2-3 GM-% IV SOLR
2.0000 g | INTRAVENOUS | Status: AC
  Administered 2013-10-24: 2 g via INTRAVENOUS

## 2013-10-24 MED ORDER — GABAPENTIN 300 MG PO CAPS
300.0000 mg | ORAL_CAPSULE | Freq: Every day | ORAL | Status: DC
  Administered 2013-10-24 – 2013-10-25 (×2): 300 mg via ORAL
  Filled 2013-10-24 (×3): qty 1

## 2013-10-24 MED ORDER — LACTATED RINGERS IV SOLN: INTRAVENOUS | Status: DC

## 2013-10-24 MED ORDER — SODIUM CHLORIDE 0.9 % IV SOLN
INTRAVENOUS | Status: DC
  Administered 2013-10-24: 22:00:00 via INTRAVENOUS

## 2013-10-24 MED ORDER — BUPIVACAINE HCL (PF) 0.25 % IJ SOLN
INTRAMUSCULAR | Status: DC | PRN
  Administered 2013-10-24: 20 mL

## 2013-10-24 MED ORDER — TRAMADOL HCL 50 MG PO TABS: 50.0000 mg | ORAL_TABLET | Freq: Four times a day (QID) | ORAL | Status: DC | PRN

## 2013-10-24 MED ORDER — PHENOL 1.4 % MT LIQD
1.0000 | OROMUCOSAL | Status: DC | PRN
Start: 2013-10-24 — End: 2013-10-26
  Filled 2013-10-24: qty 177

## 2013-10-24 MED ORDER — DEXAMETHASONE SODIUM PHOSPHATE 10 MG/ML IJ SOLN
INTRAMUSCULAR | Status: DC | PRN
  Administered 2013-10-24: 10 mg via INTRAVENOUS

## 2013-10-24 MED ORDER — METOCLOPRAMIDE HCL 5 MG/ML IJ SOLN: 5.0000 mg | Freq: Three times a day (TID) | INTRAMUSCULAR | Status: DC | PRN

## 2013-10-24 MED ORDER — BUPIVACAINE LIPOSOME 1.3 % IJ SUSP
INTRAMUSCULAR | Status: DC | PRN
  Administered 2013-10-24: 20 mL

## 2013-10-24 MED ORDER — HYDROMORPHONE HCL 1 MG/ML IJ SOLN: 0.2500 mg | INTRAMUSCULAR | Status: DC | PRN

## 2013-10-24 MED ORDER — BISACODYL 10 MG RE SUPP: 10.0000 mg | Freq: Every day | RECTAL | Status: DC | PRN

## 2013-10-24 MED ORDER — TRANEXAMIC ACID 100 MG/ML IV SOLN
2000.0000 mg | INTRAVENOUS | Status: DC | PRN
  Administered 2013-10-24: 2000 mg via TOPICAL

## 2013-10-24 MED ORDER — SODIUM CHLORIDE 0.9 % IJ SOLN
INTRAMUSCULAR | Status: DC | PRN
  Administered 2013-10-24: 30 mL

## 2013-10-24 MED ORDER — HYDROMORPHONE HCL 1 MG/ML IJ SOLN
0.5000 mg | INTRAMUSCULAR | Status: DC | PRN
  Administered 2013-10-25 (×3): 1 mg via INTRAVENOUS
  Filled 2013-10-24 (×3): qty 1

## 2013-10-24 MED ORDER — PROPOFOL INFUSION 10 MG/ML OPTIME
INTRAVENOUS | Status: DC | PRN
  Administered 2013-10-24: 140 ug/kg/min via INTRAVENOUS

## 2013-10-24 MED ORDER — LACTATED RINGERS IV SOLN
INTRAVENOUS | Status: DC | PRN
  Administered 2013-10-24 (×3): via INTRAVENOUS

## 2013-10-24 SURGICAL SUPPLY — 39 items
BAG ZIPLOCK 12X15 (MISCELLANEOUS) IMPLANT
BLADE EXTENDED COATED 6.5IN (ELECTRODE) ×3 IMPLANT
BLADE SAG 18X100X1.27 (BLADE) ×3 IMPLANT
CAPT HIP PF COP ×3 IMPLANT
CLOSURE WOUND 1/2 X4 (GAUZE/BANDAGES/DRESSINGS) ×2
COVER PERINEAL POST (MISCELLANEOUS) ×3 IMPLANT
DECANTER SPIKE VIAL GLASS SM (MISCELLANEOUS) ×3 IMPLANT
DRAPE C-ARM 42X120 X-RAY (DRAPES) ×3 IMPLANT
DRAPE STERI IOBAN 125X83 (DRAPES) ×3 IMPLANT
DRAPE U-SHAPE 47X51 STRL (DRAPES) ×9 IMPLANT
DRSG ADAPTIC 3X8 NADH LF (GAUZE/BANDAGES/DRESSINGS) ×3 IMPLANT
DRSG MEPILEX BORDER 4X4 (GAUZE/BANDAGES/DRESSINGS) ×3 IMPLANT
DRSG MEPILEX BORDER 4X8 (GAUZE/BANDAGES/DRESSINGS) ×3 IMPLANT
DURAPREP 26ML APPLICATOR (WOUND CARE) ×3 IMPLANT
ELECT REM PT RETURN 9FT ADLT (ELECTROSURGICAL) ×3
ELECTRODE REM PT RTRN 9FT ADLT (ELECTROSURGICAL) ×1 IMPLANT
EVACUATOR 1/8 PVC DRAIN (DRAIN) ×3 IMPLANT
FACESHIELD WRAPAROUND (MASK) ×12 IMPLANT
GAUZE SPONGE 4X4 12PLY STRL (GAUZE/BANDAGES/DRESSINGS) IMPLANT
GLOVE BIO SURGEON STRL SZ7.5 (GLOVE) ×3 IMPLANT
GLOVE BIO SURGEON STRL SZ8 (GLOVE) ×6 IMPLANT
GLOVE BIOGEL PI IND STRL 8 (GLOVE) ×2 IMPLANT
GLOVE BIOGEL PI INDICATOR 8 (GLOVE) ×4
GOWN STRL REUS W/TWL LRG LVL3 (GOWN DISPOSABLE) ×3 IMPLANT
GOWN STRL REUS W/TWL XL LVL3 (GOWN DISPOSABLE) ×3 IMPLANT
KIT BASIN OR (CUSTOM PROCEDURE TRAY) ×3 IMPLANT
NDL SAFETY ECLIPSE 18X1.5 (NEEDLE) ×2 IMPLANT
NEEDLE HYPO 18GX1.5 SHARP (NEEDLE) ×4
PACK TOTAL JOINT (CUSTOM PROCEDURE TRAY) ×3 IMPLANT
STRIP CLOSURE SKIN 1/2X4 (GAUZE/BANDAGES/DRESSINGS) ×4 IMPLANT
SUT ETHIBOND NAB CT1 #1 30IN (SUTURE) ×3 IMPLANT
SUT MNCRL AB 4-0 PS2 18 (SUTURE) ×3 IMPLANT
SUT VIC AB 2-0 CT1 27 (SUTURE) ×4
SUT VIC AB 2-0 CT1 TAPERPNT 27 (SUTURE) ×2 IMPLANT
SUT VLOC 180 0 24IN GS25 (SUTURE) ×3 IMPLANT
SYR 20CC LL (SYRINGE) ×3 IMPLANT
SYR 50ML LL SCALE MARK (SYRINGE) ×3 IMPLANT
TOWEL OR 17X26 10 PK STRL BLUE (TOWEL DISPOSABLE) ×3 IMPLANT
TRAY FOLEY CATH 14FRSI W/METER (CATHETERS) ×3 IMPLANT

## 2013-10-24 NOTE — Interval H&P Note (Signed)
History and Physical Interval Note:  10/24/2013 2:07 PM  Barbara Kirby  has presented today for surgery, with the diagnosis of OA LEFT HIP  The various methods of treatment have been discussed with the patient and family. After consideration of risks, benefits and other options for treatment, the patient has consented to  Procedure(s): LEFT TOTAL HIP ARTHROPLASTY ANTERIOR APPROACH (Left) as a surgical intervention .  The patient's history has been reviewed, patient examined, no change in status, stable for surgery.  I have reviewed the patient's chart and labs.  Questions were answered to the patient's satisfaction.     [name]

## 2013-10-24 NOTE — Op Note (Signed)
OPERATIVE REPORT  PREOPERATIVE DIAGNOSIS: Osteoarthritis of the Left hip.   POSTOPERATIVE DIAGNOSIS: Osteoarthritis of the Left  hip.   PROCEDURE: Left total hip arthroplasty, anterior approach.   SURGEON: Gaynelle Arabian, MD   ASSISTANT: Arlee Muslim, PA-C  ANESTHESIA:  Spinal  ESTIMATED BLOOD LOSS:- 300 ml  DRAINS: Hemovac x1.   COMPLICATIONS: None   CONDITION: PACU - hemodynamically stable.   BRIEF CLINICAL NOTE: Barbara Kirby is a 58 y.o. female who has advanced end-  stage arthritis of her Left  hip with progressively worsening pain and  dysfunction.The patient has failed nonoperative management and presents for  total hip arthroplasty.   PROCEDURE IN DETAIL: After successful administration of spinal  anesthetic, the traction boots for the [facility] bed were placed on both  feet and the patient was placed onto the [facility] bed, boots placed into the leg  holders. The Left hip was then isolated from the perineum with plastic  drapes and prepped and draped in the usual sterile fashion. ASIS and  greater trochanter were marked and a oblique incision was made, starting  at about 1 cm lateral and 2 cm distal to the ASIS and coursing towards  the anterior cortex of the femur. The skin was cut with a 10 blade  through subcutaneous tissue to the level of the fascia overlying the  tensor fascia lata muscle. The fascia was then incised in line with the  incision at the junction of the anterior third and posterior 2/3rd. The  muscle was teased off the fascia and then the interval between the TFL  and the rectus was developed. The Hohmann retractor was then placed at  the top of the femoral neck over the capsule. The vessels overlying the  capsule were cauterized and the fat on top of the capsule was removed.  A Hohmann retractor was then placed anterior underneath the rectus  femoris to give exposure to the entire anterior capsule. A T-shaped  capsulotomy was performed. The  edges were tagged and the femoral head  was identified.       Osteophytes are removed off the superior acetabulum.  The femoral neck was then cut in situ with an oscillating saw. Traction  was then applied to the left lower extremity utilizing the [facility]  traction. The femoral head was then removed. Retractors were placed  around the acetabulum and then circumferential removal of the labrum was  performed. Osteophytes were also removed. Reaming starts at 45 mm to  medialize and  Increased in 2 mm increments to 49 mm. We reamed in  approximately 40 degrees of abduction, 20 degrees anteversion. A 50 mm  pinnacle acetabular shell was then impacted in anatomic position under  fluoroscopic guidance with excellent purchase. We did not need to place  any additional dome screws. A 32 mm neutral + 4 marathon liner was then  placed into the acetabular shell.       The femoral lift was then placed along the lateral aspect of the femur  just distal to the vastus ridge. The leg was  externally rotated and capsule  was stripped off the inferior aspect of the femoral neck down to the  level of the lesser trochanter, this was done with electrocautery. The femur was lifted after this was performed. The  leg was then placed and extended in adducted position to essentially delivering the femur. We also removed the capsule superiorly and the  piriformis from the piriformis  fossa to gain excellent exposure of the  proximal femur. Rongeur was used to remove some cancellous bone to get  into the lateral portion of the proximal femur for placement of the  initial starter reamer. The starter broaches was placed  the starter broach  and was shown to go down the center of the canal. Broaching  with the  Corail system was then performed starting at size 8, coursing  Up to size 11. A size 11 had excellent torsional and rotational  and axial stability. The trial standard offset neck was then placed  with a 32 + 1 trial  head. The hip was then reduced. We confirmed that  the stem was in the canal both on AP and lateral x-rays. It also has excellent sizing. The hip was reduced with outstanding stability through full extension, full external rotation,  and then flexion in adduction internal rotation. AP pelvis was taken  and the leg lengths were measured and found to be exactly equal. Hip  was then dislocated again and the femoral head and neck removed. The  femoral broach was removed. Size 11 Corail stem with a standard offset  neck was then impacted into the femur following native anteversion. Has  excellent purchase in the canal. Excellent torsional and rotational and  axial stability. It is confirmed to be in the canal on AP and lateral  fluoroscopic views. The 32 + 1 ceramic head was placed and the hip  reduced with outstanding stability. Again AP pelvis was taken and it  confirmed that the leg lengths were equal. The wound was then copiously  irrigated with saline solution and the capsule reattached and repaired  with Ethibond suture.  20 mL of Exparel mixed with 50 mL of saline then additional 20 ml of .25% Bupivicaine injected into the capsule and into the edge of the tensor fascia lata as well as subcutaneous tissue. The fascia overlying the tensor fascia lata was  then closed with a running #1 V-Loc. Subcu was closed with interrupted  2-0 Vicryl and subcuticular running 4-0 Monocryl. Incision was cleaned  and dried. Steri-Strips and a bulky sterile dressing applied. Hemovac  drain was hooked to suction and then he was awakened and transported to  recovery in stable condition.        Please note that a surgical assistant was a medical necessity for this procedure to perform it in a safe and expeditious manner. Assistant was necessary to provide appropriate retraction of vital neurovascular structures and to prevent femoral fracture and allow for anatomic placement of the prosthesis.  Gaynelle Arabian, M.D.

## 2013-10-24 NOTE — Anesthesia Postprocedure Evaluation (Signed)
  Anesthesia Post-op Note  Patient: Barbara Kirby  Procedure(s) Performed: Procedure(s) (LRB): LEFT TOTAL HIP ARTHROPLASTY ANTERIOR APPROACH (Left)  Patient Location: PACU  Anesthesia Type: Spinal  Level of Consciousness: awake and alert   Airway and Oxygen Therapy: Patient Spontanous Breathing  Post-op Pain: mild  Post-op Assessment: Post-op Vital signs reviewed, Patient's Cardiovascular Status Stable, Respiratory Function Stable, Patent Airway and No signs of Nausea or vomiting  Last Vitals:  Filed Vitals:   10/24/13 1715  BP:   Pulse:   Temp: 36.8 C  Resp:     Post-op Vital Signs: stable   Complications: No apparent anesthesia complications

## 2013-10-24 NOTE — Transfer of Care (Signed)
Immediate Anesthesia Transfer of Care Note  Patient: Barbara Kirby  Procedure(s) Performed: Procedure(s): LEFT TOTAL HIP ARTHROPLASTY ANTERIOR APPROACH (Left)  Patient Location: PACU  Anesthesia Type:Spinal  Level of Consciousness: awake, alert , oriented and patient cooperative  Airway & Oxygen Therapy: Patient Spontanous Breathing and Patient connected to face mask oxygen  Post-op Assessment: Report given to PACU RN and Post -op Vital signs reviewed and stable  Post vital signs: Reviewed and stable  Complications: No apparent anesthesia complications

## 2013-10-24 NOTE — Anesthesia Preprocedure Evaluation (Addendum)
Anesthesia Evaluation  Patient identified by MRN, date of birth, ID band Patient awake    Reviewed: Allergy & Precautions, H&P , NPO status , Patient's Chart, lab work & pertinent test results, reviewed documented beta blocker date and time   Airway Mallampati: II TM Distance: >3 FB Neck ROM: full    Dental no notable dental hx. (+) Teeth Intact, Dental Advisory Given   Pulmonary neg pulmonary ROS,  Bronchitis. History laryngeal spasm breath sounds clear to auscultation  Pulmonary exam normal       Cardiovascular hypertension, Pt. on medications and Pt. on home beta blockers + CAD and + Past MI Rhythm:regular Rate:Normal  Tachycardia/flutter.  ECG highly suggestive of previous MI which was discussed with patient.   Neuro/Psych Back surgery 1/13 negative neurological ROS  negative psych ROS   GI/Hepatic negative GI ROS, Neg liver ROS, GERD-  Medicated and Controlled,  Endo/Other  negative endocrine ROS  Renal/GU negative Renal ROS  negative genitourinary   Musculoskeletal   Abdominal   Peds  Hematology negative hematology ROS (+)   Anesthesia Other Findings   Reproductive/Obstetrics negative OB ROS                         Anesthesia Physical Anesthesia Plan  ASA: III  Anesthesia Plan: Spinal   Post-op Pain Management:    Induction:   Airway Management Planned: Simple Face Mask  Additional Equipment:   Intra-op Plan:   Post-operative Plan:   Informed Consent: I have reviewed the patients History and Physical, chart, labs and discussed the procedure including the risks, benefits and alternatives for the proposed anesthesia with the patient or authorized representative who has indicated his/her understanding and acceptance.   Dental Advisory Given  Plan Discussed with: CRNA and Surgeon  Anesthesia Plan Comments:        Anesthesia Quick Evaluation

## 2013-10-24 NOTE — Anesthesia Procedure Notes (Signed)
Spinal  Patient location during procedure: OR Start time: 10/24/2013 2:23 PM Staffing CRNA/Resident: Dion Saucier E Preanesthetic Checklist Completed: patient identified, site marked, surgical consent, pre-op evaluation, timeout performed, IV checked, risks and benefits discussed and monitors and equipment checked Spinal Block Patient position: sitting Prep: Betadine Patient monitoring: heart rate, continuous pulse ox and blood pressure Approach: midline Location: L3-4 Injection technique: single-shot Needle Needle type: Whitacre  Needle gauge: 25 G Needle length: 9 cm Additional Notes Kit expiration date checked and confirmed.  Negative paresthesias, heme.  clear csf.  Tolerated well.

## 2013-10-25 ENCOUNTER — Encounter (HOSPITAL_COMMUNITY): Payer: Self-pay | Admitting: Orthopedic Surgery

## 2013-10-25 LAB — CBC
HCT: 36.8 % (ref 36.0–46.0)
Hemoglobin: 12.5 g/dL (ref 12.0–15.0)
MCH: 31.3 pg (ref 26.0–34.0)
MCHC: 34 g/dL (ref 30.0–36.0)
MCV: 92.2 fL (ref 78.0–100.0)
PLATELETS: 206 10*3/uL (ref 150–400)
RBC: 3.99 MIL/uL (ref 3.87–5.11)
RDW: 12.5 % (ref 11.5–15.5)
WBC: 15 10*3/uL — ABNORMAL HIGH (ref 4.0–10.5)

## 2013-10-25 LAB — BASIC METABOLIC PANEL
ANION GAP: 15 (ref 5–15)
BUN: 14 mg/dL (ref 6–23)
CO2: 23 mEq/L (ref 19–32)
CREATININE: 0.7 mg/dL (ref 0.50–1.10)
Calcium: 8.5 mg/dL (ref 8.4–10.5)
Chloride: 100 mEq/L (ref 96–112)
Glucose, Bld: 158 mg/dL — ABNORMAL HIGH (ref 70–99)
POTASSIUM: 3.9 meq/L (ref 3.7–5.3)
Sodium: 138 mEq/L (ref 137–147)

## 2013-10-25 MED ORDER — ESOMEPRAZOLE MAGNESIUM 20 MG PO CPDR
20.0000 mg | DELAYED_RELEASE_CAPSULE | Freq: Every day | ORAL | Status: DC
  Administered 2013-10-25: 20 mg via ORAL
  Filled 2013-10-25 (×3): qty 1

## 2013-10-25 NOTE — Discharge Instructions (Addendum)
Dr. Gaynelle Arabian Total Joint Specialist [facility] [address] [place], [place] [address] [phone]    ANTERIOR APPROACH TOTAL HIP REPLACEMENT POSTOPERATIVE DIRECTIONS   Hip Rehabilitation, Guidelines Following Surgery  The results of a hip operation are greatly improved after range of motion and muscle strengthening exercises. Follow all safety measures which are given to protect your hip. If any of these exercises cause increased pain or swelling in your joint, decrease the amount until you are comfortable again. Then slowly increase the exercises. Call your caregiver if you have problems or questions.  HOME CARE INSTRUCTIONS  Most of the following instructions are designed to prevent the dislocation of your new hip.  Remove items at home which could result in a fall. This includes throw rugs or furniture in walking pathways.  Continue medications as instructed at time of discharge.  You may have some home medications which will be placed on hold until you complete the course of blood thinner medication.  You may start showering once you are discharged home but do not submerge the incision under water. Just pat the incision dry and apply a dry gauze dressing on daily. Do not put on socks or shoes without following the instructions of your caregivers.  Sit on high chairs which makes it easier to stand.  Sit on chairs with arms. Use the chair arms to help push yourself up when arising.  Keep your leg on the side of the operation out in front of you when standing up.  Arrange for the use of a toilet seat elevator so you are not sitting low.    Walk with walker as instructed.  You may resume a sexual relationship in one month or when given the OK by your caregiver.  Use walker as long as suggested by your caregivers.  You may put full weight on your legs and walk as much as is comfortable. Avoid periods of inactivity such as sitting longer than an hour  when not asleep. This helps prevent blood clots.  You may return to work once you are cleared by Engineer, production.  Do not drive a car for 6 weeks or until released by your surgeon.  Do not drive while taking narcotics.  Wear elastic stockings for three weeks following surgery during the day but you may remove then at night.  Make sure you keep all of your appointments after your operation with all of your doctors and caregivers. You should call the office at the above phone number and make an appointment for approximately two weeks after the date of your surgery. Change the dressing daily and reapply a dry dressing each time. Please pick up a stool softener and laxative for home use as long as you are requiring pain medications.  Continue to use ice on the hip for pain and swelling from surgery. You may notice swelling that will progress down to the foot and ankle.  This is normal after surgery.  Elevate the leg when you are not up walking on it.   It is important for you to complete the blood thinner medication as prescribed by your doctor.  Continue to use the breathing machine which will help keep your temperature down.  It is common for your temperature to cycle up and down following surgery, especially at night when you are not up moving around and exerting yourself.  The breathing machine keeps your lungs expanded and your temperature down.  RANGE OF MOTION AND STRENGTHENING EXERCISES  These exercises are designed to help you keep full movement of your hip joint. Follow your caregiver's or physical therapist's instructions. Perform all exercises about fifteen times, three times per day or as directed. Exercise both hips, even if you have had only one joint replacement. These exercises can be done on a training (exercise) mat, on the floor, on a table or on a bed. Use whatever works the best and is most comfortable for you. Use music or television while you are exercising so that the exercises are a  pleasant break in your day. This will make your life better with the exercises acting as a break in routine you can look forward to.  Lying on your back, slowly slide your foot toward your buttocks, raising your knee up off the floor. Then slowly slide your foot back down until your leg is straight again.  Lying on your back spread your legs as far apart as you can without causing discomfort.  Lying on your side, raise your upper leg and foot straight up from the floor as far as is comfortable. Slowly lower the leg and repeat.  Lying on your back, tighten up the muscle in the front of your thigh (quadriceps muscles). You can do this by keeping your leg straight and trying to raise your heel off the floor. This helps strengthen the largest muscle supporting your knee.  Lying on your back, tighten up the muscles of your buttocks both with the legs straight and with the knee bent at a comfortable angle while keeping your heel on the floor.   SKILLED REHAB INSTRUCTIONS: If the patient is transferred to a skilled rehab facility following release from the hospital, a list of the current medications will be sent to the facility for the patient to continue.  When discharged from the skilled rehab facility, please have the facility set up the patient's [place] prior to being released. Also, the skilled facility will be responsible for providing the patient with their medications at time of release from the facility to include their pain medication, the muscle relaxants, and their blood thinner medication. If the patient is still at the rehab facility at time of the two week follow up appointment, the skilled rehab facility will also need to assist the patient in arranging follow up appointment in our office and any transportation needs.  MAKE SURE YOU:  Understand these instructions.  Will watch your condition.  Will get help right away if you are not doing well or get worse.  Pick up  stool softner and laxative for home. Do not submerge incision under water. May shower. Continue to use ice for pain and swelling from surgery. Total Hip Protocol.  Take Xarelto for two and a half more weeks, then discontinue Xarelto. Once the patient has completed the Xarelto, they may resume the 81 mg Aspirin.  Information on my medicine - XARELTO (Rivaroxaban)  This medication education was reviewed with me or my healthcare representative as part of my discharge preparation.  The pharmacist that spoke with me during my hospital stay was:  [facility]  Why was Xarelto prescribed for you? Xarelto was prescribed for you to reduce the risk of blood clots forming after orthopedic surgery. The medical term for these abnormal blood clots is venous thromboembolism (VTE).  What do you need to know about xarelto ? Take your Xarelto ONCE DAILY at the same time every day. You may take it either with or without food.  If you have difficulty swallowing the tablet whole, you may crush it and mix in applesauce just prior to taking your dose.  Take Xarelto exactly as prescribed by your doctor and DO NOT stop taking Xarelto without talking to the doctor who prescribed the medication.  Stopping without other VTE prevention medication to take the place of Xarelto may increase your risk of developing a clot.  After discharge, you should have regular check-up appointments with your healthcare provider that is prescribing your Xarelto.    What do you do if you miss a dose? If you miss a dose, take it as soon as you remember on the same day then continue your regularly scheduled once daily regimen the next day. Do not take two doses of Xarelto on the same day.   Important Safety Information A possible side effect of Xarelto is bleeding. You should call your healthcare provider right away if you experience any of the following:   Bleeding from an injury or your nose that does not  stop.   Unusual colored urine (red or dark brown) or unusual colored stools (red or black).   Unusual bruising for unknown reasons.   A serious fall or if you hit your head (even if there is no bleeding).  Some medicines may interact with Xarelto and might increase your risk of bleeding while on Xarelto. To help avoid this, consult your healthcare provider or pharmacist prior to using any new prescription or non-prescription medications, including herbals, vitamins, non-steroidal anti-inflammatory drugs (NSAIDs) and supplements.  This website has more information on Xarelto: [email or web address]

## 2013-10-25 NOTE — Evaluation (Signed)
Physical Therapy Evaluation Patient Details Name: Barbara Kirby MRN: [id number] DOB: 29-May-1955 Today's Date: 10/25/2013   History of Present Illness  58 yo L THA-DA 10/24/13. Hx of spinal fusion-lumbar, vertigo, HTN, chronic pain.   Clinical Impression  On eval, pt was Min guard assist for mobility-able to ambulate ~75 feet with rolling walker. Mobilizing very well.     Follow Up Recommendations Home health PT    Equipment Recommendations  None recommended by PT    Recommendations for Other Services OT consult     Precautions / Restrictions Precautions Precautions: Fall Restrictions Weight Bearing Restrictions: No LLE Weight Bearing: Weight bearing as tolerated      Mobility  Bed Mobility Overal bed mobility: Needs Assistance Bed Mobility: Supine to Sit     Supine to sit: Min guard        Transfers Overall transfer level: Needs assistance Equipment used: Rolling walker (2 wheeled) Transfers: Sit to/from Stand Sit to Stand: Min guard            Ambulation/Gait Ambulation/Gait assistance: Min guard Ambulation Distance (Feet): 75 Feet Assistive device: Rolling walker (2 wheeled) Gait Pattern/deviations: Step-to pattern;Step-through pattern;Decreased stride length        Stairs            Wheelchair Mobility    Modified Rankin (Stroke Patients Only)       Balance                                             Pertinent Vitals/Pain Pain Assessment: 0-10 Pain Score: 3  Pain Location: L THA Pain Descriptors / Indicators: Aching Pain Intervention(s): Monitored during session;Ice applied    Home Living Family/patient expects to be discharged to:: Private residence Living Arrangements: Spouse/significant other;Children;Parent Available Help at Discharge: Family Type of Home: House Home Access: Stairs to enter Entrance Stairs-Rails: None Entrance Stairs-Number of Steps: 3 Home Layout: One level Home Equipment: Environmental consultant  - 4 wheels;Walker - 2 wheels;Tub bench      Prior Function Level of Independence: Independent               Hand Dominance        Extremity/Trunk Assessment   Upper Extremity Assessment: Overall WFL for tasks assessed                  Cervical / Trunk Assessment: Normal  Communication   Communication: No difficulties  Cognition Arousal/Alertness: Awake/alert Behavior During Therapy: WFL for tasks assessed/performed Overall Cognitive Status: Within Functional Limits for tasks assessed                      General Comments      Exercises        Assessment/Plan    PT Assessment Patient needs continued PT services  PT Diagnosis Difficulty walking;Generalized weakness;Acute pain   PT Problem List Decreased strength;Decreased range of motion;Decreased activity tolerance;Decreased balance;Decreased mobility;Pain;Decreased knowledge of use of DME  PT Treatment Interventions DME instruction;Gait training;Stair training;Functional mobility training;Therapeutic activities;Patient/family education;Balance training;Therapeutic exercise   PT Goals (Current goals can be found in the Care Plan section) Acute Rehab PT Goals Patient Stated Goal: return to independence PT Goal Formulation: With patient Time For Goal Achievement: 11/01/13 Potential to Achieve Goals: Good    Frequency 7X/week   Barriers to discharge        Co-evaluation  End of Session Equipment Utilized During Treatment: Gait belt Activity Tolerance: Patient tolerated treatment well Patient left: in chair;with call bell/phone within reach           Time: 1111-1135 PT Time Calculation (min): 24 min   Charges:   PT Evaluation $Initial PT Evaluation Tier I: 1 Procedure PT Treatments $Gait Training: 8-22 mins   PT G Codes:          Weston Anna, MPT Pager: [phone]

## 2013-10-25 NOTE — Progress Notes (Signed)
   Subjective: 1 Day Post-Op Procedure(s) (LRB): LEFT TOTAL HIP ARTHROPLASTY ANTERIOR APPROACH (Left) Patient reports pain as moderate through the night. Patient seen in rounds with Dr. Wynelle Link.  Doing a little better this morning.  Family in room at bedside. Patient is well, but has had some minor complaints of pain in the hip, requiring pain medications We will start therapy today.  Plan is to go Home after hospital stay.  Objective: Vital signs in last 24 hours: Temp:  [97.8 F (36.6 C)-98.8 F (37.1 C)] 98.1 F (36.7 C) (10/01 0611) Pulse Rate:  [62-71] 62 (10/01 0611) Resp:  [14-20] 18 (10/01 0611) BP: (114-160)/(47-73) 114/47 mmHg (10/01 0611) SpO2:  [95 %-100 %] 95 % (10/01 0611) Weight:  [109.317 kg (241 lb)] 109.317 kg (241 lb) (09/30 1214)  Intake/Output from previous day:  Intake/Output Summary (Last 24 hours) at 10/25/13 0842 Last data filed at 10/25/13 0612  Gross per 24 hour  Intake 2533.08 ml  Output   2710 ml  Net -176.92 ml    Intake/Output this shift: UOP 1650 since MN  Labs:  Recent Labs  10/25/13 0417  HGB 12.5    Recent Labs  10/25/13 0417  WBC 15.0*  RBC 3.99  HCT 36.8  PLT 206    Recent Labs  10/25/13 0417  NA 138  K 3.9  CL 100  CO2 23  BUN 14  CREATININE 0.70  GLUCOSE 158*  CALCIUM 8.5   No results found for this basename: LABPT, INR,  in the last 72 hours  EXAM General - Patient is Alert, Appropriate and Oriented Extremity - Neurovascular intact Sensation intact distally Dorsiflexion/Plantar flexion intact Dressing - dressing C/D/I Motor Function - intact, moving foot and toes well on exam.  Hemovac pulled without difficulty.  Past Medical History  Diagnosis Date  . Hypertension   . Tachycardia     occasional  . Fluttering heart     occasional  . GERD (gastroesophageal reflux disease)   . Arthritis   . Endometriosis   . Fibroids     uterine  . Anemia 2010    h/o "heavy period for months"  . H/O  bronchitis   . Laryngeal spasm 2010    hx of from bronchitis, no problems since  . Positive H. pylori test   . Complication of anesthesia     "epidural with child birth was too high-hard time breathing"    Assessment/Plan: 1 Day Post-Op Procedure(s) (LRB): LEFT TOTAL HIP ARTHROPLASTY ANTERIOR APPROACH (Left) Principal Problem:   OA (osteoarthritis) of hip  Estimated body mass index is 36.65 kg/(m^2) as calculated from the following:   Height as of this encounter: 5\' 8"  (1.727 m).   Weight as of this encounter: 109.317 kg (241 lb). Advance diet Up with therapy Plan for discharge tomorrow Discharge home with home health  DVT Prophylaxis - Xarelto Weight Bearing As Tolerated left Leg Hemovac Pulled Begin Therapy  Arlee Muslim, PA-C Orthopaedic Surgery 10/25/2013, 8:42 AM

## 2013-10-25 NOTE — Progress Notes (Signed)
Utilization review completed.  

## 2013-10-25 NOTE — Progress Notes (Signed)
Physical Therapy Treatment Patient Details Name: Barbara Kirby MRN: [id number] DOB: 1956/01/07 Today's Date: 10/25/2013    History of Present Illness 59 yo L THA-DA 10/24/13. Hx of spinal fusion-lumbar, vertigo, HTN, chronic pain.     PT Comments    Progressing well with mobility. Plan is for d/c home on tomorrow. Will need to practice steps prior to d/c.   Follow Up Recommendations  Home health PT     Equipment Recommendations  None recommended by PT    Recommendations for Other Services OT consult     Precautions / Restrictions Precautions Precautions: Fall Restrictions Weight Bearing Restrictions: No LLE Weight Bearing: Weight bearing as tolerated    Mobility      General bed mobility comments: pt sitting in recliner  Transfers Overall transfer level: Needs assistance Equipment used: Rolling walker (2 wheeled) Transfers: Sit to/from Stand Sit to Stand: Supervision         General transfer comment: VCS safety, hand placement.   Ambulation/Gait Ambulation/Gait assistance: Supervision Ambulation Distance (Feet): 150 Feet Assistive device: Rolling walker (2 wheeled) Gait Pattern/deviations: Step-to pattern;Step-through pattern;Antalgic;Decreased stride length     General Gait Details: slow, steady gait speed. VCS sequence.    Stairs            Wheelchair Mobility    Modified Rankin (Stroke Patients Only)       Balance                                    Cognition Arousal/Alertness: Awake/alert Behavior During Therapy: WFL for tasks assessed/performed Overall Cognitive Status: Within Functional Limits for tasks assessed                      Exercises Total Joint Exercises Ankle Circles/Pumps: AROM;Both;15 reps;Seated Quad Sets: Both;10 reps;Seated Heel Slides: AAROM;Left;10 reps;Seated Hip ABduction/ADduction: AAROM;Left;10 reps;Seated    General Comments        Pertinent Vitals/Pain Pain Assessment:  0-10 Pain Score: 3  Pain Location: L THA Pain Descriptors / Indicators: Aching Pain Intervention(s): Monitored during session;Ice applied    Home Living Family/patient expects to be discharged to:: Private residence Living Arrangements: Spouse/significant other;Children;Parent Available Help at Discharge: Family Type of Home: House Home Access: Stairs to enter Entrance Stairs-Rails: None Home Layout: One level Home Equipment: Environmental consultant - 4 wheels;Walker - 2 wheels;Tub bench      Prior Function Level of Independence: Independent          PT Goals (current goals can now be found in the care plan section) Acute Rehab PT Goals Patient Stated Goal: return to independence PT Goal Formulation: With patient Time For Goal Achievement: 11/01/13 Potential to Achieve Goals: Good Progress towards PT goals: Progressing toward goals    Frequency  7X/week    PT Plan Current plan remains appropriate    Co-evaluation             End of Session Equipment Utilized During Treatment: Gait belt Activity Tolerance: Patient tolerated treatment well Patient left: in chair;with call bell/phone within reach;with family/visitor present     Time: 1435-1501 PT Time Calculation (min): 26 min  Charges:  $Gait Training: 8-22 mins $Therapeutic Exercise: 8-22 mins                    G Codes:      Weston Anna, MPT Pager: [phone]

## 2013-10-25 NOTE — Evaluation (Signed)
Occupational Therapy Evaluation Patient Details Name: Barbara Kirby MRN: [id number] DOB: 06-10-1955 Today's Date: 10/25/2013    History of Present Illness 58 yo L THA-DA 10/24/13. Hx of spinal fusion-lumbar, vertigo, HTN, chronic pain.    Clinical Impression   Pt doing very well and is motivated. Practiced up to 3in1 and standing at the sink for grooming. Will benefit from continued OT to progress ADL independence for return home with family.    Follow Up Recommendations  No OT follow up    Equipment Recommendations  3 in 1 bedside comode    Recommendations for Other Services       Precautions / Restrictions Precautions Precautions: Fall Restrictions Weight Bearing Restrictions: No LLE Weight Bearing: Weight bearing as tolerated      Mobility Bed Mobility Overal bed mobility: Needs Assistance Bed Mobility: Supine to Sit     Supine to sit: Min guard        Transfers Overall transfer level: Needs assistance Equipment used: Rolling walker (2 wheeled) Transfers: Sit to/from Stand Sit to Stand: Min guard              Balance                                            ADL Overall ADL's : Needs assistance/impaired Eating/Feeding: Independent;Sitting   Grooming: Wash/dry hands;Min guard;Standing   Upper Body Bathing: Set up;Sitting   Lower Body Bathing: Minimal assistance;Sit to/from stand   Upper Body Dressing : Set up;Sitting   Lower Body Dressing: Minimal assistance;Sit to/from stand   Toilet Transfer: Min guard;Ambulation;BSC;RW   Toileting- Water quality scientist and Hygiene: Min guard;Sit to/from stand         General ADL Comments: DIscussed LB dressing and sequence and pt state she will have assist with LB dressing. She declines AE need. She owns a [place] and would like to practice with how to transfer on and off of it for d/c. Overall doing very well today.      Vision                     Perception      Praxis      Pertinent Vitals/Pain Pain Assessment: 0-10 Pain Score: 3  Pain Location: L THA Pain Descriptors / Indicators: Aching Pain Intervention(s): Monitored during session;Ice applied     Hand Dominance     Extremity/Trunk Assessment Upper Extremity Assessment Upper Extremity Assessment: Overall WFL for tasks assessed       Cervical / Trunk Assessment Cervical / Trunk Assessment: Normal   Communication Communication Communication: No difficulties   Cognition Arousal/Alertness: Awake/alert Behavior During Therapy: WFL for tasks assessed/performed Overall Cognitive Status: Within Functional Limits for tasks assessed                     General Comments       Exercises       Shoulder Instructions      Home Living Family/patient expects to be discharged to:: Private residence Living Arrangements: Spouse/significant other;Children;Parent Available Help at Discharge: Family Type of Home: House Home Access: Stairs to enter Technical brewer of Steps: 3 Entrance Stairs-Rails: None Home Layout: One level     Bathroom Shower/Tub: Teacher, early years/pre: Standard     Home Equipment: Environmental consultant - 4 wheels;Walker - 2 wheels;Tub bench  Prior Functioning/Environment Level of Independence: Independent             OT Diagnosis: Generalized weakness   OT Problem List: Decreased strength;Decreased knowledge of use of DME or AE   OT Treatment/Interventions: Self-care/ADL training;Patient/family education;Therapeutic activities;DME and/or AE instruction    OT Goals(Current goals can be found in the care plan section) Acute Rehab OT Goals Patient Stated Goal: return to independence OT Goal Formulation: With patient Time For Goal Achievement: 11/01/13 Potential to Achieve Goals: Good  OT Frequency: Min 2X/week   Barriers to D/C:            Co-evaluation              End of Session Equipment Utilized During  Treatment: Gait belt;Rolling walker  Activity Tolerance: Patient tolerated treatment well Patient left: in chair;with call bell/phone within reach   Time: 1111-1135 OT Time Calculation (min): 24 min Charges:  OT General Charges $OT Visit: 1 Procedure OT Evaluation $Initial OT Evaluation Tier I: 1 Procedure G-Codes:    Jules Schick [phone] 10/25/2013, 12:50 PM

## 2013-10-25 NOTE — Care Management Note (Signed)
    Page 1 of 2   10/25/2013     8:47:40 AM CARE MANAGEMENT NOTE 10/25/2013  Patient:  Barbara Kirby, Barbara Kirby   Account Number:  [id number]  Date Initiated:  10/25/2013  Documentation initiated by:  [facility]  Subjective/Objective Assessment:   adm: LEFT TOTAL HIP ARTHROPLASTY ANTERIOR APPROACH     Action/Plan:   discharge planning   Anticipated DC Date:  10/26/2013   Anticipated DC Plan:  [place]  CM consult      [facility] Choice  HOME HEALTH   Choice offered to / List presented to:  C-1 Patient   DME arranged  NA      DME agency  NA     [place] arranged  HH-2 PT      [place] [place]   Status of service:  Completed, signed off Medicare Important Message given?   (If response is "NO", the following Medicare IM given date fields will be blank) Date Medicare IM given:   Medicare IM given by:   Date Additional Medicare IM given:   Additional Medicare IM given by:    Discharge Disposition:  [place]  Per UR Regulation:    If discussed at Long Length of Stay Meetings, dates discussed:    Comments:  10/25/13 08:40 CM met with pt and spouse, Juanda Crumble [phone] in room to offer choice of home health agency. Pt chooses [facility] to render HHPT. Address and contact information verified with Juanda Crumble.  No DME is needed as pt has both a rolling walker and a 3n1 at home.  CM called referral to [employer], [place] [facility].  No other CM needs were communicated.  Mariane Masters, BSN, [place].

## 2013-10-26 LAB — BASIC METABOLIC PANEL
Anion gap: 12 (ref 5–15)
BUN: 12 mg/dL (ref 6–23)
CALCIUM: 8.6 mg/dL (ref 8.4–10.5)
CO2: 27 mEq/L (ref 19–32)
Chloride: 100 mEq/L (ref 96–112)
Creatinine, Ser: 0.59 mg/dL (ref 0.50–1.10)
GFR calc Af Amer: >90 mL/min (ref >90)
Glucose, Bld: 133 mg/dL — ABNORMAL HIGH (ref 70–99)
Potassium: 4.1 mEq/L (ref 3.7–5.3)
Sodium: 139 mEq/L (ref 137–147)

## 2013-10-26 LAB — CBC
HEMATOCRIT: 36.4 % (ref 36.0–46.0)
Hemoglobin: 12.3 g/dL (ref 12.0–15.0)
MCH: 31.5 pg (ref 26.0–34.0)
MCHC: 33.8 g/dL (ref 30.0–36.0)
MCV: 93.1 fL (ref 78.0–100.0)
Platelets: 194 10*3/uL (ref 150–400)
RBC: 3.91 MIL/uL (ref 3.87–5.11)
RDW: 12.9 % (ref 11.5–15.5)
WBC: 17.8 10*3/uL — ABNORMAL HIGH (ref 4.0–10.5)

## 2013-10-26 MED ORDER — RIVAROXABAN 10 MG PO TABS: 10.0000 mg | ORAL_TABLET | Freq: Every day | ORAL | Status: DC

## 2013-10-26 MED ORDER — TRAMADOL HCL 50 MG PO TABS: 50.0000 mg | ORAL_TABLET | Freq: Four times a day (QID) | ORAL | Status: DC | PRN

## 2013-10-26 MED ORDER — METHOCARBAMOL 500 MG PO TABS: 500.0000 mg | ORAL_TABLET | Freq: Four times a day (QID) | ORAL | Status: DC | PRN

## 2013-10-26 MED ORDER — HYDROMORPHONE HCL 2 MG PO TABS: 2.0000 mg | ORAL_TABLET | ORAL | Status: DC | PRN

## 2013-10-26 NOTE — Progress Notes (Signed)
Physical Therapy Treatment Patient Details Name: Barbara Kirby MRN: [id number] DOB: [birth year] Today's Date: 10/26/2013    History of Present Illness 58 yo L THA-DA 10/24/13. Hx of spinal fusion-lumbar, vertigo, HTN, chronic pain.     PT Comments    Pt ambulated in hallway and practiced steps with spouse holding RW.  Pt also performed LE exercises and provided with HEP handout.  Pt had no further questions/concerns regarding d/c home today.  Follow Up Recommendations  Home health PT     Equipment Recommendations  None recommended by PT    Recommendations for Other Services       Precautions / Restrictions Precautions Precautions: Fall;None Precaution Comments: direct anterior approach Restrictions Weight Bearing Restrictions: No LLE Weight Bearing: Weight bearing as tolerated    Mobility  Bed Mobility Overal bed mobility: Needs Assistance Bed Mobility: Supine to Sit     Supine to sit: Supervision     General bed mobility comments: increased time and effort, self assist for L LE  Transfers Overall transfer level: Needs assistance Equipment used: Rolling walker (2 wheeled) Transfers: Sit to/from Stand Sit to Stand: Supervision         General transfer comment: verbal cues for hand placement especially for tubbench  Ambulation/Gait Ambulation/Gait assistance: Supervision Ambulation Distance (Feet): 240 Feet Assistive device: Rolling walker (2 wheeled) Gait Pattern/deviations: Step-through pattern;Antalgic;Decreased stance time - right     General Gait Details: verbal cues for posture    Stairs Stairs: Yes Stairs assistance: Min guard Stair Management: Backwards;With walker;Step to pattern Number of Stairs: 4 General stair comments: pt and spouse educated and performed safe stair technique with cues for sequence, RW placement and safety  Wheelchair Mobility    Modified Rankin (Stroke Patients Only)       Balance                                     Cognition Arousal/Alertness: Awake/alert Behavior During Therapy: WFL for tasks assessed/performed Overall Cognitive Status: Within Functional Limits for tasks assessed                      Exercises Total Joint Exercises Ankle Circles/Pumps: AROM;Both;15 reps Quad Sets: Both;AROM;15 reps Heel Slides: AAROM;Left;15 reps Hip ABduction/ADduction: 15 reps;Left;AAROM Long Arc Quad: Seated;AROM;Left;10 reps Knee Flexion: AROM;Standing;Left;10 reps (ham curls) Marching in Standing: AROM;Left;10 reps;Standing (all standing exercises performed with UE support) Standing Hip Extension: AROM;Left;10 reps;Standing    General Comments        Pertinent Vitals/Pain Pain Assessment: 0-10 Pain Score: 2  Pain Location: L hip Pain Descriptors / Indicators: Aching Pain Intervention(s): Monitored during session;Repositioned;Ice applied    Home Living                      Prior Function            PT Goals (current goals can now be found in the care plan section) Progress towards PT goals: Progressing toward goals    Frequency  7X/week    PT Plan Current plan remains appropriate    Co-evaluation             End of Session   Activity Tolerance: Patient tolerated treatment well Patient left: in chair;with call bell/phone within reach     Time: 0846-0910 PT Time Calculation (min): 24 min  Charges:  $Gait Training: 8-22 mins $Therapeutic Exercise: 8-22 mins  G Codes:      ,KATHrine E 2013-11-13, 11:18 AM Carmelia Bake, PT, DPT 13-Nov-2013 Pager: [phone]

## 2013-10-26 NOTE — Progress Notes (Signed)
   Subjective: 2 Days Post-Op Procedure(s) (LRB): LEFT TOTAL HIP ARTHROPLASTY ANTERIOR APPROACH (Left) Patient reports pain as mild.   Patient seen in rounds with Dr. Wynelle Link.  The deep pain has already improved. Patient is well, but has had some minor complaints of pain in the hip region, requiring pain medications Patient is ready to go home today.  Objective: Vital signs in last 24 hours: Temp:  [98 F (36.7 C)-98.7 F (37.1 C)] 98.4 F (36.9 C) (10/02 0521) Pulse Rate:  [58-63] 58 (10/02 0521) Resp:  [18] 18 (10/02 0521) BP: (127-157)/(55-65) 127/55 mmHg (10/02 0521) SpO2:  [97 %-99 %] 98 % (10/02 0521)  Intake/Output from previous day:  Intake/Output Summary (Last 24 hours) at 10/26/13 0836 Last data filed at 10/25/13 1906  Gross per 24 hour  Intake 1191.67 ml  Output   1350 ml  Net -158.33 ml    Intake/Output this shift:    Labs:  Recent Labs  10/25/13 0417 10/26/13 0442  HGB 12.5 12.3    Recent Labs  10/25/13 0417 10/26/13 0442  WBC 15.0* 17.8*  RBC 3.99 3.91  HCT 36.8 36.4  PLT 206 194    Recent Labs  10/25/13 0417 10/26/13 0442  NA 138 139  K 3.9 4.1  CL 100 100  CO2 23 27  BUN 14 12  CREATININE 0.70 0.59  GLUCOSE 158* 133*  CALCIUM 8.5 8.6   No results found for this basename: LABPT, INR,  in the last 72 hours  EXAM: General - Patient is Alert, Appropriate and Oriented Extremity - Neurovascular intact Sensation intact distally Dorsiflexion/Plantar flexion intact Incision - clean, dry, no drainage, healing Motor Function - intact, moving foot and toes well on exam.   Assessment/Plan: 2 Days Post-Op Procedure(s) (LRB): LEFT TOTAL HIP ARTHROPLASTY ANTERIOR APPROACH (Left) Procedure(s) (LRB): LEFT TOTAL HIP ARTHROPLASTY ANTERIOR APPROACH (Left) Past Medical History  Diagnosis Date  . Hypertension   . Tachycardia     occasional  . Fluttering heart     occasional  . GERD (gastroesophageal reflux disease)   . Arthritis   .  Endometriosis   . Fibroids     uterine  . Anemia 2010    h/o "heavy period for months"  . H/O bronchitis   . Laryngeal spasm 2010    hx of from bronchitis, no problems since  . Positive H. pylori test   . Complication of anesthesia     "epidural with child birth was too high-hard time breathing"   Principal Problem:   OA (osteoarthritis) of hip  Estimated body mass index is 36.65 kg/(m^2) as calculated from the following:   Height as of this encounter: 5\' 8"  (1.727 m).   Weight as of this encounter: 109.317 kg (241 lb). Up with therapy Discharge home with home health Diet - Cardiac diet Follow up - in 2 weeks on Thursday October 15th Activity - WBAT Disposition - Home Condition Upon Discharge - Good D/C Meds - See DC Summary DVT Prophylaxis - Xarelto  Arlee Muslim, PA-C Orthopaedic Surgery 10/26/2013, 8:36 AM

## 2013-10-26 NOTE — Progress Notes (Signed)
Occupational Therapy Treatment Patient Details Name: Barbara Kirby MRN: [id number] DOB: 1955/07/22 Today's Date: 10/26/2013    History of present illness 58 yo L THA-DA 10/24/13. Hx of spinal fusion-lumbar, vertigo, HTN, chronic pain.    OT comments  Pt's husband stating they think they have a 3in1 at home but if not, they are planning to have a handicap height commode put in. They decline needing a 3in1 ordered from hospital. Pt doing well and performed tubbench transfer this am with OT. Husband present.   Follow Up Recommendations  No OT follow up    Equipment Recommendations  None recommended by OT    Recommendations for Other Services      Precautions / Restrictions Precautions Precautions: Fall Restrictions Weight Bearing Restrictions: No LLE Weight Bearing: Weight bearing as tolerated       Mobility Bed Mobility                  Transfers Overall transfer level: Needs assistance Equipment used: Rolling walker (2 wheeled) Transfers: Sit to/from Stand Sit to Stand: Supervision         General transfer comment: verbal cues for hand placement especially for tubbench    Balance                                   ADL                                   Tub/ Shower Transfer: Tub transfer;Minimal assistance;Tub bench;Rolling walker     General ADL Comments: pt required min assist to help raise L LE over the simulated tub edge. Demonstrated technique for safety and to make as easy as possible. Husband present for session. She declined need to toilet during session. Educated on option to lean side to side for washing periareas or standing in tub if she feels able versus wash standing at the sink.       Vision                     Perception     Praxis      Cognition   Behavior During Therapy: WFL for tasks assessed/performed Overall Cognitive Status: Within Functional Limits for tasks assessed                        Extremity/Trunk Assessment               Exercises     Shoulder Instructions       General Comments      Pertinent Vitals/ Pain       Pain Assessment: 0-10 Pain Score: 6  Pain Location: L hip Pain Descriptors / Indicators: Aching Pain Intervention(s): Repositioned;Ice applied  Home Living                                          Prior Functioning/Environment              Frequency Min 2X/week     Progress Toward Goals  OT Goals(current goals can now be found in the care plan section)  Progress towards OT goals: Progressing toward goals     Plan Discharge plan remains appropriate    Co-evaluation  End of Session Equipment Utilized During Treatment: Rolling walker   Activity Tolerance Patient tolerated treatment well   Patient Left in chair;with call bell/phone within reach;with family/visitor present   Nurse Communication          Time: [phone] OT Time Calculation (min): 14 min  Charges: OT General Charges $OT Visit: 1 Procedure OT Treatments $Therapeutic Activity: 8-22 mins  Jules Schick [phone] 10/26/2013, 9:40 AM

## 2013-10-26 NOTE — Discharge Summary (Signed)
Physician Discharge Summary   Patient ID: Barbara Kirby MRN: [id number] DOB/AGE: 1955-06-01 58 y.o.  Admit date: 10/24/2013 Discharge date: 10-26-2013  Primary Diagnosis:  Osteoarthritis of the Left hip.   Admission Diagnoses:  Past Medical History  Diagnosis Date  . Hypertension   . Tachycardia     occasional  . Fluttering heart     occasional  . GERD (gastroesophageal reflux disease)   . Arthritis   . Endometriosis   . Fibroids     uterine  . Anemia 2010    h/o "heavy period for months"  . H/O bronchitis   . Laryngeal spasm 2010    hx of from bronchitis, no problems since  . Positive H. pylori test   . Complication of anesthesia     "epidural with child birth was too high-hard time breathing"   Discharge Diagnoses:   Principal Problem:   OA (osteoarthritis) of hip  Estimated body mass index is 36.65 kg/(m^2) as calculated from the following:   Height as of this encounter: 5' 8"  (1.727 m).   Weight as of this encounter: 109.317 kg (241 lb).  Procedure(s) (LRB): LEFT TOTAL HIP ARTHROPLASTY ANTERIOR APPROACH (Left)   Consults: None  HPI: Barbara Kirby is a 58 y.o. female who has advanced end-  stage arthritis of her Left hip with progressively worsening pain and  dysfunction.The patient has failed nonoperative management and presents for  total hip arthroplasty.   Laboratory Data: Admission on 10/24/2013  Component Date Value Ref Range Status  . WBC 10/25/2013 15.0* 4.0 - 10.5 K/uL Final  . RBC 10/25/2013 3.99  3.87 - 5.11 MIL/uL Final  . Hemoglobin 10/25/2013 12.5  12.0 - 15.0 g/dL Final  . HCT 10/25/2013 36.8  36.0 - 46.0 % Final  . MCV 10/25/2013 92.2  78.0 - 100.0 fL Final  . MCH 10/25/2013 31.3  26.0 - 34.0 pg Final  . MCHC 10/25/2013 34.0  30.0 - 36.0 g/dL Final  . RDW 10/25/2013 12.5  11.5 - 15.5 % Final  . Platelets 10/25/2013 206  150 - 400 K/uL Final  . Sodium 10/25/2013 138  137 - 147 mEq/L Final  . Potassium 10/25/2013 3.9  3.7 - 5.3  mEq/L Final  . Chloride 10/25/2013 100  96 - 112 mEq/L Final  . CO2 10/25/2013 23  19 - 32 mEq/L Final  . Glucose, Bld 10/25/2013 158* 70 - 99 mg/dL Final  . BUN 10/25/2013 14  6 - 23 mg/dL Final  . Creatinine, Ser 10/25/2013 0.70  0.50 - 1.10 mg/dL Final  . Calcium 10/25/2013 8.5  8.4 - 10.5 mg/dL Final  . GFR calc non Af Amer 10/25/2013 >90  >90 mL/min Final  . GFR calc Af Amer 10/25/2013 >90  >90 mL/min Final   Comment: (NOTE)                          The eGFR has been calculated using the CKD EPI equation.                          This calculation has not been validated in all clinical situations.                          eGFR's persistently <90 mL/min signify possible Chronic Kidney  Disease.  . Anion gap 10/25/2013 15  5 - 15 Final  . WBC 10/26/2013 17.8* 4.0 - 10.5 K/uL Final  . RBC 10/26/2013 3.91  3.87 - 5.11 MIL/uL Final  . Hemoglobin 10/26/2013 12.3  12.0 - 15.0 g/dL Final  . HCT 10/26/2013 36.4  36.0 - 46.0 % Final  . MCV 10/26/2013 93.1  78.0 - 100.0 fL Final  . MCH 10/26/2013 31.5  26.0 - 34.0 pg Final  . MCHC 10/26/2013 33.8  30.0 - 36.0 g/dL Final  . RDW 10/26/2013 12.9  11.5 - 15.5 % Final  . Platelets 10/26/2013 194  150 - 400 K/uL Final  . Sodium 10/26/2013 139  137 - 147 mEq/L Final  . Potassium 10/26/2013 4.1  3.7 - 5.3 mEq/L Final  . Chloride 10/26/2013 100  96 - 112 mEq/L Final  . CO2 10/26/2013 27  19 - 32 mEq/L Final  . Glucose, Bld 10/26/2013 133* 70 - 99 mg/dL Final  . BUN 10/26/2013 12  6 - 23 mg/dL Final  . Creatinine, Ser 10/26/2013 0.59  0.50 - 1.10 mg/dL Final  . Calcium 10/26/2013 8.6  8.4 - 10.5 mg/dL Final  . GFR calc non Af Amer 10/26/2013 >90  >90 mL/min Final  . GFR calc Af Amer 10/26/2013 >90  >90 mL/min Final   Comment: (NOTE)                          The eGFR has been calculated using the CKD EPI equation.                          This calculation has not been validated in all clinical situations.                           eGFR's persistently <90 mL/min signify possible Chronic Kidney                          Disease.  Georgiann Hahn gap 10/26/2013 12  5 - 15 Final  Hospital Outpatient Visit on 10/19/2013  Component Date Value Ref Range Status  . ABO/RH(D) 10/19/2013 A POS   Final  Hospital Outpatient Visit on 10/19/2013  Component Date Value Ref Range Status  . MRSA, PCR 10/19/2013 NEGATIVE  NEGATIVE Final  . Staphylococcus aureus 10/19/2013 NEGATIVE  NEGATIVE Final   Comment:                                 The Xpert SA Assay (FDA                          approved for NASAL specimens                          in patients over 77 years of age),                          is one component of                          a comprehensive surveillance  program.  Test performance has                          been validated by [facility] for patients greater                          than or equal to 41 year old.                          It is not intended                          to diagnose infection nor to                          guide or monitor treatment.  Marland Kitchen aPTT 10/19/2013 29  24 - 37 seconds Final  . WBC 10/19/2013 6.4  4.0 - 10.5 K/uL Final  . RBC 10/19/2013 4.36  3.87 - 5.11 MIL/uL Final  . Hemoglobin 10/19/2013 14.2  12.0 - 15.0 g/dL Final  . HCT 10/19/2013 40.0  36.0 - 46.0 % Final  . MCV 10/19/2013 91.7  78.0 - 100.0 fL Final  . MCH 10/19/2013 32.6  26.0 - 34.0 pg Final  . MCHC 10/19/2013 35.5  30.0 - 36.0 g/dL Final  . RDW 10/19/2013 12.7  11.5 - 15.5 % Final  . Platelets 10/19/2013 223  150 - 400 K/uL Final  . Sodium 10/19/2013 138  137 - 147 mEq/L Final  . Potassium 10/19/2013 4.5  3.7 - 5.3 mEq/L Final  . Chloride 10/19/2013 99  96 - 112 mEq/L Final  . CO2 10/19/2013 28  19 - 32 mEq/L Final  . Glucose, Bld 10/19/2013 101* 70 - 99 mg/dL Final  . BUN 10/19/2013 15  6 - 23 mg/dL Final  . Creatinine, Ser 10/19/2013 0.74  0.50 - 1.10 mg/dL Final  .  Calcium 10/19/2013 9.2  8.4 - 10.5 mg/dL Final  . Total Protein 10/19/2013 7.6  6.0 - 8.3 g/dL Final  . Albumin 10/19/2013 4.0  3.5 - 5.2 g/dL Final  . AST 10/19/2013 21  0 - 37 U/L Final  . ALT 10/19/2013 21  0 - 35 U/L Final  . Alkaline Phosphatase 10/19/2013 48  39 - 117 U/L Final  . Total Bilirubin 10/19/2013 0.5  0.3 - 1.2 mg/dL Final  . GFR calc non Af Amer 10/19/2013 >90  >90 mL/min Final  . GFR calc Af Amer 10/19/2013 >90  >90 mL/min Final   Comment: (NOTE)                          The eGFR has been calculated using the CKD EPI equation.                          This calculation has not been validated in all clinical situations.                          eGFR's persistently <90 mL/min signify possible Chronic Kidney  Disease.  . Anion gap 10/19/2013 11  5 - 15 Final  . Prothrombin Time 10/19/2013 13.5  11.6 - 15.2 seconds Final  . INR 10/19/2013 1.03  0.00 - 1.49 Final  . ABO/RH(D) 10/19/2013 A POS   Final  . Antibody Screen 10/19/2013 NEG   Final  . Sample Expiration 10/19/2013 10/27/2013   Final  . Color, Urine 10/19/2013 AMBER* YELLOW Final   BIOCHEMICALS MAY BE AFFECTED BY COLOR  . APPearance 10/19/2013 CLEAR  CLEAR Final  . Specific Gravity, Urine 10/19/2013 1.025  1.005 - 1.030 Final  . pH 10/19/2013 6.0  5.0 - 8.0 Final  . Glucose, UA 10/19/2013 NEGATIVE  NEGATIVE mg/dL Final  . Hgb urine dipstick 10/19/2013 NEGATIVE  NEGATIVE Final  . Bilirubin Urine 10/19/2013 NEGATIVE  NEGATIVE Final  . Ketones, ur 10/19/2013 NEGATIVE  NEGATIVE mg/dL Final  . Protein, ur 10/19/2013 NEGATIVE  NEGATIVE mg/dL Final  . Urobilinogen, UA 10/19/2013 0.2  0.0 - 1.0 mg/dL Final  . Nitrite 10/19/2013 NEGATIVE  NEGATIVE Final  . Leukocytes, UA 10/19/2013 NEGATIVE  NEGATIVE Final   MICROSCOPIC NOT DONE ON URINES WITH NEGATIVE PROTEIN, BLOOD, LEUKOCYTES, NITRITE, OR GLUCOSE <1000 mg/dL.     X-Rays:Dg Chest 2 View  10/19/2013   CLINICAL DATA:  Preoperative for left  total hip joint replacement; history of hypertension.  EXAM: CHEST  2 VIEW  COMPARISON:  None.  FINDINGS: The lungs are adequately inflated and clear. The heart and mediastinal structures are normal. There is no pleural effusion. The bony thorax exhibits mild degenerative disc change at multiple thoracic levels.  IMPRESSION: There is no active cardiopulmonary disease.   Electronically Signed   By: David  Martinique   On: 10/19/2013 11:05   Dg Hip Complete Left  10/19/2013   CLINICAL DATA:  Preop for left hip replacement  EXAM: LEFT HIP - COMPLETE 2+ VIEW  COMPARISON:  None.  FINDINGS: Three views of the left hip submitted. Extensive osteoarthritic changes with significant narrowing superior hip joint space. There is sclerosis of left superior acetabulum. Mild sclerotic changes are noted femoral head. Moderate degenerative changes right hip joint with narrowing of superior hip joint space.  IMPRESSION: Extensive osteoarthritic changes left hip joint. No acute fracture or subluxation. Moderate degenerative changes right hip joint.   Electronically Signed   By: Lahoma Crocker M.D.   On: 10/19/2013 10:59   Dg Pelvis Portable  10/24/2013   CLINICAL DATA:  Left hip replacement.  EXAM: DG C-ARM 1-60 MIN - NRPT [facility]; PORTABLE PELVIS 1-2 VIEWS  COMPARISON:  None.  FINDINGS: Single AP view of the pelvis demonstrates a left total hip arthroplasty in place. Surgical drain is noted. The device is located and no fracture is seen. Right hip osteoarthritis is identified.  IMPRESSION: Left total hip replacement without evidence of complication.   Electronically Signed   By: Inge Rise M.D.   On: 10/24/2013 16:55   Dg C-arm 1-60 Min-no Report  10/24/2013   CLINICAL DATA:  Left hip replacement.  EXAM: DG C-ARM 1-60 MIN - NRPT [facility]; PORTABLE PELVIS 1-2 VIEWS  COMPARISON:  None.  FINDINGS: Single AP view of the pelvis demonstrates a left total hip arthroplasty in place. Surgical drain is noted. The device is located and no  fracture is seen. Right hip osteoarthritis is identified.  IMPRESSION: Left total hip replacement without evidence of complication.   Electronically Signed   By: Inge Rise M.D.   On: 10/24/2013 16:55    EKG: Orders placed during the  hospital encounter of 10/19/13  . EKG 12-LEAD  . EKG 12-LEAD     Hospital Course: Patient was admitted to [facility] and taken to the OR and underwent the above state procedure without complications.  Patient tolerated the procedure well and was later transferred to the recovery room and then to the orthopaedic floor for postoperative care.  They were given PO and IV analgesics for pain control following their surgery.  They were given 24 hours of postoperative antibiotics of  Anti-infectives   Start     Dose/Rate Route Frequency Ordered Stop   10/25/13 0600  ceFAZolin (ANCEF) IVPB 2 g/50 mL premix     2 g 100 mL/hr over 30 Minutes Intravenous On call to O.R. 10/24/13 1207 10/24/13 1400   10/24/13 2000  ceFAZolin (ANCEF) IVPB 2 g/50 mL premix     2 g 100 mL/hr over 30 Minutes Intravenous Every 6 hours 10/24/13 1816 10/25/13 0247     and started on DVT prophylaxis in the form of Xarelto.   PT and OT were ordered for total hip protocol.  The patient was allowed to be WBAT with therapy. Discharge planning was consulted to help with postop disposition and equipment needs.  Patient had a tough night on the evening of surgery with pain but was doing a little better the next morning.  They started to get up OOB with therapy on day one.  Hemovac drain was pulled without difficulty.  Continued to work with therapy into day two.  Dressing was changed on day two and the incision was healing well.  Patient was seen in rounds by Dr. Wynelle Link on POD 2 and was ready to go home.  Discharge home with home health  Diet - Cardiac diet  Follow up - in 2 weeks on Thursday October 15th  Activity - WBAT  Disposition - Home  Condition Upon Discharge - Good  D/C Meds -  See DC Summary  DVT Prophylaxis - Xarelto   Discharge Instructions   Call MD / Call 911    Complete by:  As directed   If you experience chest pain or shortness of breath, CALL 911 and be transported to the hospital emergency room.  If you develope a fever above 101 F, pus (white drainage) or increased drainage or redness at the wound, or calf pain, call your surgeon's office.     Change dressing    Complete by:  As directed   You may change your dressing dressing daily with sterile 4 x 4 inch gauze dressing and paper tape.  Do not submerge the incision under water.     Constipation Prevention    Complete by:  As directed   Drink plenty of fluids.  Prune juice may be helpful.  You may use a stool softener, such as Colace (over the counter) 100 mg twice a day.  Use MiraLax (over the counter) for constipation as needed.     Diet - low sodium heart healthy    Complete by:  As directed      Discharge instructions    Complete by:  As directed   Pick up stool softner and laxative for home. Do not submerge incision under water. May shower. Continue to use ice for pain and swelling from surgery.  Total Hip Protocol.  Take Xarelto for two and a half more weeks, then discontinue Xarelto. Once the patient has completed the Xarelto, they may resume the 81 mg Aspirin.     Do not  sit on low chairs, stoools or toilet seats, as it may be difficult to get up from low surfaces    Complete by:  As directed      Driving restrictions    Complete by:  As directed   No driving until released by the physician.     Increase activity slowly as tolerated    Complete by:  As directed      Lifting restrictions    Complete by:  As directed   No lifting until released by the physician.     Patient may shower    Complete by:  As directed   You may shower without a dressing once there is no drainage.  Do not wash over the wound.  If drainage remains, do not shower until drainage stops.     TED hose     Complete by:  As directed   Use stockings (TED hose) for 3 weeks on both leg(s).  You may remove them at night for sleeping.     Weight bearing as tolerated    Complete by:  As directed             Medication List    STOP taking these medications       amoxicillin 500 MG capsule  Commonly known as:  AMOXIL     aspirin EC 81 MG tablet     clarithromycin 500 MG tablet  Commonly known as:  BIAXIN     VIMOVO PO      TAKE these medications       acetaminophen 325 MG tablet  Commonly known as:  TYLENOL  Take 650 mg by mouth once as needed for moderate pain.     escitalopram 10 MG tablet  Commonly known as:  LEXAPRO  Take 5 mg by mouth at bedtime.     esomeprazole 20 MG capsule  Commonly known as:  NEXIUM  Take 20 mg by mouth at bedtime.     gabapentin 300 MG capsule  Commonly known as:  NEURONTIN  Take 300 mg by mouth at bedtime.     HYDROmorphone 2 MG tablet  Commonly known as:  DILAUDID  Take 1-2 tablets (2-4 mg total) by mouth every 4 (four) hours as needed for moderate pain or severe pain.     methocarbamol 500 MG tablet  Commonly known as:  ROBAXIN  Take 1 tablet (500 mg total) by mouth every 6 (six) hours as needed for muscle spasms.     metoprolol succinate 50 MG 24 hr tablet  Commonly known as:  TOPROL-XL  Take 50 mg by mouth at bedtime. Take with or immediately following a meal.     rivaroxaban 10 MG Tabs tablet  Commonly known as:  XARELTO  - Take 1 tablet (10 mg total) by mouth daily with breakfast. Take Xarelto for two and a half more weeks, then discontinue Xarelto.  - Once the patient has completed the Xarelto, they may resume the 81 mg Aspirin.     traMADol 50 MG tablet  Commonly known as:  ULTRAM  Take 1-2 tablets (50-100 mg total) by mouth every 6 (six) hours as needed (mild pain).     triamterene-hydrochlorothiazide 37.5-25 MG per tablet  Commonly known as:  MAXZIDE-25  Take 0.5 tablets by mouth at bedtime.           Follow-up  Information   Follow up with [facility]. (home health physical therapy)    Contact information:   [address]  [place] [address] [phone]       Follow up with [name], MD. Schedule an appointment as soon as possible for a visit on 11/08/2013. (Call office at [phone] to set up follow up appointment on Thursday October 15th.)    Specialty:  Orthopedic Surgery   Contact information:   [address] [place] [address] [phone]       Signed: Arlee Muslim, PA-C Orthopaedic Surgery 10/26/2013, 8:47 AM

## 2013-10-30 ENCOUNTER — Encounter (INDEPENDENT_AMBULATORY_CARE_PROVIDER_SITE_OTHER): Payer: Self-pay | Admitting: *Deleted

## 2013-11-20 ENCOUNTER — Ambulatory Visit (INDEPENDENT_AMBULATORY_CARE_PROVIDER_SITE_OTHER): Payer: PRIVATE HEALTH INSURANCE | Admitting: Internal Medicine

## 2013-12-10 ENCOUNTER — Encounter (INDEPENDENT_AMBULATORY_CARE_PROVIDER_SITE_OTHER): Payer: Self-pay | Admitting: Internal Medicine

## 2013-12-10 ENCOUNTER — Other Ambulatory Visit (INDEPENDENT_AMBULATORY_CARE_PROVIDER_SITE_OTHER): Payer: Self-pay | Admitting: *Deleted

## 2013-12-10 ENCOUNTER — Ambulatory Visit (INDEPENDENT_AMBULATORY_CARE_PROVIDER_SITE_OTHER): Payer: PRIVATE HEALTH INSURANCE | Admitting: Internal Medicine

## 2013-12-10 ENCOUNTER — Telehealth (INDEPENDENT_AMBULATORY_CARE_PROVIDER_SITE_OTHER): Payer: Self-pay | Admitting: *Deleted

## 2013-12-10 VITALS — BP 112/72 | HR 64 | Temp 97.0°F | Ht 68.0 in | Wt 239.9 lb

## 2013-12-10 DIAGNOSIS — Z1211 Encounter for screening for malignant neoplasm of colon: Secondary | ICD-10-CM

## 2013-12-10 DIAGNOSIS — Z79899 Other long term (current) drug therapy: Secondary | ICD-10-CM

## 2013-12-10 MED ORDER — PEG 3350-KCL-NA BICARB-NACL 420 G PO SOLR: 4000.0000 mL | Freq: Once | ORAL | Status: DC

## 2013-12-10 NOTE — Telephone Encounter (Signed)
Patient needs trilyte 

## 2013-12-10 NOTE — Patient Instructions (Signed)
Screening colonoscopy 

## 2013-12-10 NOTE — Progress Notes (Signed)
Subjective:    Patient ID: Barbara Kirby, female    DOB: 1955-03-28, 58 y.o.   MRN: [id number]  HPI Referred to our office by Dr. Quillian Quince for screening colonoscopy. Appetite is good. No weight loss. No dysphagia. Acid reflux controlled with Nexium. No abdominal pain.  Her BMs are normal. Sometimes when her abdomen is cramping, she sometimes has a sensation in her back and radiating up into her face. She feels hot. Her sinuses stop up. Has occurred x 4 in the past. Has not occurred in a couple of months. Related to having BMs.  T he pain will  last from 5-10 minutes. No melena or BRRB.  She thinks her last colonoscopy was in 2000 by Dr. Laural Golden, Normal.  Family hx of colon cancer in an aunt (4s) and grandmother (68s) on mother's side.  04/13/2013 H and H 13.8 and 39.2, MCV 91, Platelet ct 164.  Review of Systems Married. Works and [employer]. One child in good health.     Past Medical History  Diagnosis Date  . Hypertension   . Tachycardia     occasional  . Fluttering heart     occasional  . GERD (gastroesophageal reflux disease)   . Arthritis   . Endometriosis   . Fibroids     uterine  . Anemia 2010    h/o "heavy period for months"  . H/O bronchitis   . Laryngeal spasm 2010    hx of from bronchitis, no problems since  . Positive H. pylori test   . Complication of anesthesia     "epidural with child birth was too high-hard time breathing"    Past Surgical History  Procedure Laterality Date  . Back surgery  01/2011    "lateral lift"  . Laparoscopy  2000    "female parts- thinking of hysterectomy"  . Esophagogastroduodenoscopy endoscopy  2000  . Colonoscopy  2000  . Tonsillectomy  age 47  . Dilation and curettage of uterus  2000  . Breast biopsy Left 07/2013    "metal pin in place, cyst biopsy-benign"  . Total hip arthroplasty Left 10/24/2013    Procedure: LEFT TOTAL HIP ARTHROPLASTY ANTERIOR APPROACH;  Surgeon: [name], MD;  Location: [facility];  Service: Orthopedics;  Laterality: Left;    Allergies  Allergen Reactions  . Augmentin [Amoxicillin-Pot Clavulanate] Nausea And Vomiting  . Codeine Nausea Only  . Oxycodone Nausea Only    Current Outpatient Prescriptions on File Prior to Visit  Medication Sig Dispense Refill  . acetaminophen (TYLENOL) 325 MG tablet Take 650 mg by mouth once as needed for moderate pain.    Marland Kitchen escitalopram (LEXAPRO) 10 MG tablet Take 5 mg by mouth at bedtime.    Marland Kitchen esomeprazole (NEXIUM) 20 MG capsule Take 20 mg by mouth at bedtime.    . gabapentin (NEURONTIN) 300 MG capsule Take 300 mg by mouth at bedtime.    . metoprolol succinate (TOPROL-XL) 50 MG 24 hr tablet Take 50 mg by mouth at bedtime. Take with or immediately following a meal.    . triamterene-hydrochlorothiazide (MAXZIDE-25) 37.5-25 MG per tablet Take 0.5 tablets by mouth at bedtime.     No current facility-administered medications on file prior to visit.     Objective:   Physical Exam  Filed Vitals:   12/10/13 1021  Height: 5\' 8"  (1.727 m)  Weight: 239 lb 14.4 oz (108.818 kg)   Alert and oriented. Skin warm and dry. Oral mucosa is moist.   .  Sclera anicteric, conjunctivae is pink. Thyroid not enlarged. No cervical lymphadenopathy. Lungs clear. Heart regular rate and rhythm.  Abdomen is soft. Bowel sounds are positive. No hepatomegaly. No abdominal masses felt. No tenderness.  No edema to lower extremities.         Assessment & Plan:  Screening colonoscopy. Medication management.

## 2014-01-02 ENCOUNTER — Encounter (INDEPENDENT_AMBULATORY_CARE_PROVIDER_SITE_OTHER): Payer: Self-pay

## 2014-02-14 ENCOUNTER — Ambulatory Visit (HOSPITAL_COMMUNITY)
Admission: RE | Admit: 2014-02-14 | Payer: PRIVATE HEALTH INSURANCE | Source: Ambulatory Visit | Admitting: Internal Medicine

## 2014-02-14 ENCOUNTER — Encounter (HOSPITAL_COMMUNITY): Admission: RE | Payer: Self-pay | Source: Ambulatory Visit

## 2014-02-14 SURGERY — COLONOSCOPY
Anesthesia: Moderate Sedation

## 2014-02-19 ENCOUNTER — Other Ambulatory Visit: Payer: Self-pay | Admitting: Family Medicine

## 2014-02-19 DIAGNOSIS — N649 Disorder of breast, unspecified: Secondary | ICD-10-CM

## 2014-03-08 ENCOUNTER — Ambulatory Visit
Admission: RE | Admit: 2014-03-08 | Discharge: 2014-03-08 | Disposition: A | Discharge status: Home / Self Care | Payer: PRIVATE HEALTH INSURANCE | Source: Ambulatory Visit | Attending: Family Medicine | Admitting: Family Medicine

## 2014-03-08 DIAGNOSIS — N649 Disorder of breast, unspecified: Secondary | ICD-10-CM

## 2014-04-01 ENCOUNTER — Other Ambulatory Visit (INDEPENDENT_AMBULATORY_CARE_PROVIDER_SITE_OTHER): Payer: Self-pay | Admitting: *Deleted

## 2014-04-01 DIAGNOSIS — Z800 Family history of malignant neoplasm of digestive organs: Secondary | ICD-10-CM

## 2014-04-01 DIAGNOSIS — Z1211 Encounter for screening for malignant neoplasm of colon: Secondary | ICD-10-CM

## 2014-04-26 ENCOUNTER — Telehealth (INDEPENDENT_AMBULATORY_CARE_PROVIDER_SITE_OTHER): Payer: Self-pay | Admitting: *Deleted

## 2014-04-26 DIAGNOSIS — Z1211 Encounter for screening for malignant neoplasm of colon: Secondary | ICD-10-CM

## 2014-04-26 MED ORDER — PEG 3350-KCL-NA BICARB-NACL 420 G PO SOLR: 4000.0000 mL | Freq: Once | ORAL | Status: DC

## 2014-04-26 NOTE — Telephone Encounter (Signed)
Patient needs trilyte 

## 2014-05-03 ENCOUNTER — Telehealth (INDEPENDENT_AMBULATORY_CARE_PROVIDER_SITE_OTHER): Payer: Self-pay | Admitting: *Deleted

## 2014-05-03 NOTE — Telephone Encounter (Signed)
Referring MD/PCP: daniel   Procedure: tcs  Reason/Indication:  Screening, fam hx colon ca  Has patient had this procedure before?  Yes, 2000   If so, when, by whom and where?    Is there a family history of colon cancer?  Yes, aunt & grandmother  Who?  What age when diagnosed?    Is patient diabetic?   no      Does patient have prosthetic heart valve?  no  Do you have a pacemaker?  no  Has patient ever had endocarditis? no  Has patient had joint replacement within last 12 months?  Yes -- 09/2013 Hip  Does patient tend to be constipated or take laxatives? no  Is patient on Coumadin, Plavix and/or Aspirin? yes  Medications: asa 81 mg daily, tylenol 325 mg prn, lexapro 5 mg daily, nexium 20 mg daily, neurotin 300 mg @ bedtime, metoprolol 50 mg daily, triamterene/hctz 37.5/25 mg 1/2 tab @ bedtime  Allergies: see epic  Medication Adjustment: asa 2 days  Procedure date & time: 05/29/14 at 930

## 2014-05-07 NOTE — Telephone Encounter (Signed)
agree

## 2014-05-29 ENCOUNTER — Ambulatory Visit (HOSPITAL_COMMUNITY)
Admission: RE | Admit: 2014-05-29 | Discharge: 2014-05-29 | Disposition: A | Discharge status: Home / Self Care | Payer: PRIVATE HEALTH INSURANCE | Source: Ambulatory Visit | Attending: Internal Medicine | Admitting: Internal Medicine

## 2014-05-29 ENCOUNTER — Encounter (HOSPITAL_COMMUNITY)
Admission: RE | Disposition: A | Discharge status: Home / Self Care | Payer: Self-pay | Source: Ambulatory Visit | Attending: Internal Medicine

## 2014-05-29 ENCOUNTER — Encounter (HOSPITAL_COMMUNITY): Payer: Self-pay | Admitting: *Deleted

## 2014-05-29 DIAGNOSIS — Z7982 Long term (current) use of aspirin: Secondary | ICD-10-CM | POA: Diagnosis not present

## 2014-05-29 DIAGNOSIS — Z800 Family history of malignant neoplasm of digestive organs: Secondary | ICD-10-CM | POA: Insufficient documentation

## 2014-05-29 DIAGNOSIS — M1990 Unspecified osteoarthritis, unspecified site: Secondary | ICD-10-CM | POA: Diagnosis not present

## 2014-05-29 DIAGNOSIS — Z96642 Presence of left artificial hip joint: Secondary | ICD-10-CM | POA: Insufficient documentation

## 2014-05-29 DIAGNOSIS — D259 Leiomyoma of uterus, unspecified: Secondary | ICD-10-CM | POA: Insufficient documentation

## 2014-05-29 DIAGNOSIS — Z886 Allergy status to analgesic agent status: Secondary | ICD-10-CM | POA: Diagnosis not present

## 2014-05-29 DIAGNOSIS — Z888 Allergy status to other drugs, medicaments and biological substances status: Secondary | ICD-10-CM | POA: Diagnosis not present

## 2014-05-29 DIAGNOSIS — I4892 Unspecified atrial flutter: Secondary | ICD-10-CM | POA: Diagnosis not present

## 2014-05-29 DIAGNOSIS — K219 Gastro-esophageal reflux disease without esophagitis: Secondary | ICD-10-CM | POA: Diagnosis not present

## 2014-05-29 DIAGNOSIS — D649 Anemia, unspecified: Secondary | ICD-10-CM | POA: Insufficient documentation

## 2014-05-29 DIAGNOSIS — I10 Essential (primary) hypertension: Secondary | ICD-10-CM | POA: Insufficient documentation

## 2014-05-29 DIAGNOSIS — Z881 Allergy status to other antibiotic agents status: Secondary | ICD-10-CM | POA: Insufficient documentation

## 2014-05-29 DIAGNOSIS — Z1211 Encounter for screening for malignant neoplasm of colon: Secondary | ICD-10-CM | POA: Diagnosis not present

## 2014-05-29 DIAGNOSIS — Z90710 Acquired absence of both cervix and uterus: Secondary | ICD-10-CM | POA: Diagnosis not present

## 2014-05-29 HISTORY — PX: COLONOSCOPY: SHX5424

## 2014-05-29 SURGERY — COLONOSCOPY
Anesthesia: Moderate Sedation

## 2014-05-29 MED ORDER — CEFAZOLIN SODIUM-DEXTROSE 2-3 GM-% IV SOLR
INTRAVENOUS | Status: AC
  Filled 2014-05-29: qty 50

## 2014-05-29 MED ORDER — DEXTROSE 5 % IV SOLN: 2.0000 g | Freq: Once | INTRAVENOUS | Status: DC

## 2014-05-29 MED ORDER — MEPERIDINE HCL 50 MG/ML IJ SOLN
INTRAMUSCULAR | Status: DC | PRN
  Administered 2014-05-29 (×2): 25 mg via INTRAVENOUS

## 2014-05-29 MED ORDER — CEFAZOLIN SODIUM-DEXTROSE 2-3 GM-% IV SOLR
2.0000 g | Freq: Once | INTRAVENOUS | Status: AC
  Administered 2014-05-29: 2 g via INTRAVENOUS

## 2014-05-29 MED ORDER — MIDAZOLAM HCL 5 MG/5ML IJ SOLN
INTRAMUSCULAR | Status: AC
  Filled 2014-05-29: qty 10

## 2014-05-29 MED ORDER — HYOSCYAMINE SULFATE 0.125 MG PO TABS: ORAL_TABLET | ORAL | Status: DC

## 2014-05-29 MED ORDER — MEPERIDINE HCL 50 MG/ML IJ SOLN
INTRAMUSCULAR | Status: AC
  Filled 2014-05-29: qty 1

## 2014-05-29 MED ORDER — SODIUM CHLORIDE 0.9 % IV SOLN
INTRAVENOUS | Status: DC
  Administered 2014-05-29: 09:00:00 via INTRAVENOUS

## 2014-05-29 MED ORDER — MIDAZOLAM HCL 5 MG/5ML IJ SOLN
INTRAMUSCULAR | Status: DC | PRN
  Administered 2014-05-29: 3 mg via INTRAVENOUS
  Administered 2014-05-29: 2 mg via INTRAVENOUS

## 2014-05-29 MED ORDER — STERILE WATER FOR IRRIGATION IR SOLN
Status: DC | PRN
  Administered 2014-05-29: 09:00:00

## 2014-05-29 NOTE — H&P (Signed)
Barbara Kirby is an 59 y.o. female.   Chief Complaint:  Patient is here for colonoscopy. HPI:  Patient is 59 year old Caucasian female who is here for screening colonoscopy. She denies rectal bleeding or change in her bowel habits. She does give history of intermittent episodes of lower abdominal pain with defecation associated with pain radiating through her back as well as headache an diaphoreses. She feels weak enhanced coli down for a few minutes. She has had only had 4 or 5 episodes in the last 8 months.  family history significant for CRC in maternal grandmother and maternal aunt and there were both in their 89s.  Patient's last colonoscopy was in 2000.  Past Medical History  Diagnosis Date  . Hypertension   . Tachycardia     occasional  . Fluttering heart     occasional  . GERD (gastroesophageal reflux disease)   . Arthritis   . Endometriosis   . Fibroids     uterine  . Anemia 2010    h/o "heavy period for months"  . H/O bronchitis   . Laryngeal spasm 2010    hx of from bronchitis, no problems since  . Positive H. pylori test   . Complication of anesthesia     "epidural with child birth was too high-hard time breathing"    Past Surgical History  Procedure Laterality Date  . Back surgery  01/2011    "lateral lift"  . Laparoscopy  2000    "female parts- thinking of hysterectomy"  . Esophagogastroduodenoscopy endoscopy  2000  . Colonoscopy  2000  . Tonsillectomy  age 20  . Dilation and curettage of uterus  2000  . Breast biopsy Left 07/2013    "metal pin in place, cyst biopsy-benign"  . Total hip arthroplasty Left 10/24/2013    Procedure: LEFT TOTAL HIP ARTHROPLASTY ANTERIOR APPROACH;  Surgeon: [name], MD;  Location: [facility];  Service: Orthopedics;  Laterality: Left;    History reviewed. No pertinent family history. Social History:  reports that she has never smoked. She has never used smokeless tobacco. She reports that she does not drink alcohol or use  illicit drugs.  Allergies:  Allergies  Allergen Reactions  . Augmentin [Amoxicillin-Pot Clavulanate] Nausea And Vomiting  . Codeine Nausea Only  . Oxycodone Nausea Only    Medications Prior to Admission  Medication Sig Dispense Refill  . acetaminophen (TYLENOL) 325 MG tablet Take 650 mg by mouth once as needed for moderate pain.    . metoprolol succinate (TOPROL-XL) 50 MG 24 hr tablet Take 50 mg by mouth at bedtime. Take with or immediately following a meal.    . polyethylene glycol-electrolytes (TRILYTE) 420 G solution Take 4,000 mLs by mouth once. 4000 mL 0  . aspirin 81 MG tablet Take 81 mg by mouth daily.    Marland Kitchen escitalopram (LEXAPRO) 10 MG tablet Take 5 mg by mouth at bedtime.    Marland Kitchen esomeprazole (NEXIUM) 20 MG capsule Take 20 mg by mouth at bedtime.    . gabapentin (NEURONTIN) 300 MG capsule Take 300 mg by mouth at bedtime.    . triamterene-hydrochlorothiazide (MAXZIDE-25) 37.5-25 MG per tablet Take 0.5 tablets by mouth at bedtime.      No results found for this or any previous visit (from the past 48 hour(s)). No results found.  ROS  Blood pressure 149/73, pulse 65, temperature 97.5 F (36.4 C), temperature source Oral, resp. rate 9, height 5\' 8"  (1.727 m), weight 239 lb (108.41 kg), SpO2 98 %.  Physical Exam  Constitutional: She appears well-developed and well-nourished.  HENT:  Mouth/Throat: Oropharynx is clear and moist.  Eyes: Conjunctivae are normal. No scleral icterus.  Neck: No thyromegaly present.  Cardiovascular: Normal rate, regular rhythm and normal heart sounds.   No murmur heard. Respiratory: Effort normal and breath sounds normal.  GI: Soft. She exhibits no distension and no mass. There is no tenderness.  Musculoskeletal: She exhibits no edema.  Lymphadenopathy:    She has no cervical adenopathy.  Neurological: She is alert.  Skin: Skin is warm and dry.     Assessment/Plan  Screening colonoscopy. Patient's risk may be somewhat elevated.  family history  of CRC in maternal grandmother and maternal aunt at late onset  , U 05/29/2014, 9:23 AM

## 2014-05-29 NOTE — Op Note (Signed)
COLONOSCOPY PROCEDURE REPORT  PATIENT:  Barbara Kirby  MR#:  [id number] Birthdate:  05/13/1955, 59 y.o., female Endoscopist:  Dr. Rogene Houston, MD Referred By:  Dr.  Gar Ponto, MD Procedure Date: 05/29/2014  Procedure:   Colonoscopy  Indications:   Patient is 59 year old Caucasian female who is undergoing screening colonoscopy. Her last exam was in 2000. Family history significant for colon carcinoma in maternal grandmother and maternal aunt in their  48s.  Informed Consent:  The procedure and risks were reviewed with the patient and informed consent was obtained.  Medications:  Demerol 50 mg IV Versed 5 mg IV  Description of procedure:  After a digital rectal exam was performed, that colonoscope was advanced from the anus through the rectum and colon to the area of the cecum, ileocecal valve and appendiceal orifice. The cecum was deeply intubated. These structures were well-seen and photographed for the record. From the level of the cecum and ileocecal valve, the scope was slowly and cautiously withdrawn. The mucosal surfaces were carefully surveyed utilizing scope tip to flexion to facilitate fold flattening as needed. The scope was pulled down into the rectum where a thorough exam including retroflexion was performed.  Findings:   Prep excellent. Normal mucosa of cecum, ascending colon, hepatic flexure, transverse colon, descending colon and sigmoid colon.  Normal rectal mucosa and anal rectal junction.   Therapeutic/Diagnostic Maneuvers Performed:   None  Complications:   none  Cecal Withdrawal Time:  8  minutes  Impression:  Normal colonoscopy.   Comment;  Sporadic lower abdominal pain associated with urgency and with cervical symptoms appears to be due to IBS.  Recommendations:  Standard instructions given. Hyoscyamine sublingual 1 to 2 tablets 3 times a day when necessary. Next screening exam in 10 years  , U  05/29/2014 10:02 AM  CC: Dr. Gar Ponto, MD & Dr. No ref. provider found

## 2014-05-29 NOTE — Discharge Instructions (Signed)
Resume usual medications and diet.  Hyoscyamine sublingual 1 to 2 tablets up to 3 times a day as needed.  No driving for 24 hours.  Next screening exam in 10 years.  Colonoscopy, Care After These instructions give you information on caring for yourself after your procedure. Your doctor may also give you more specific instructions. Call your doctor if you have any problems or questions after your procedure. HOME CARE  Do not drive for 24 hours.  Do not sign important papers or use machinery for 24 hours.  You may shower.  You may go back to your usual activities, but go slower for the first 24 hours.  Take rest breaks often during the first 24 hours.  Walk around or use warm packs on your belly (abdomen) if you have belly cramping or gas.  Drink enough fluids to keep your pee (urine) clear or pale yellow.  Resume your normal diet. Avoid heavy or fried foods.  Avoid drinking alcohol for 24 hours or as told by your doctor.  Only take medicines as told by your doctor. If a tissue sample (biopsy) was taken during the procedure:   Do not take aspirin or blood thinners for 7 days, or as told by your doctor.  Do not drink alcohol for 7 days, or as told by your doctor.  Eat soft foods for the first 24 hours. GET HELP IF: You still have a small amount of blood in your poop (stool) 2-3 days after the procedure. GET HELP RIGHT AWAY IF:  You have more than a small amount of blood in your poop.  You see clumps of tissue (blood clots) in your poop.  Your belly is puffy (swollen).  You feel sick to your stomach (nauseous) or throw up (vomit).  You have a fever.  You have belly pain that gets worse and medicine does not help. MAKE SURE YOU:  Understand these instructions.  Will watch your condition.  Will get help right away if you are not doing well or get worse. Document Released: 02/13/2010 Document Revised: 01/16/2013 Document Reviewed: 09/18/2012 [facility] Patient  Information 2015 [place], [place]. This information is not intended to replace advice given to you by your health care provider. Make sure you discuss any questions you have with your health care provider.

## 2014-05-30 ENCOUNTER — Encounter (HOSPITAL_COMMUNITY): Payer: Self-pay | Admitting: Internal Medicine

## 2014-09-11 ENCOUNTER — Other Ambulatory Visit: Payer: Self-pay | Admitting: Family Medicine

## 2014-09-11 DIAGNOSIS — N6002 Solitary cyst of left breast: Secondary | ICD-10-CM

## 2014-09-17 ENCOUNTER — Ambulatory Visit
Admission: RE | Admit: 2014-09-17 | Discharge: 2014-09-17 | Disposition: A | Discharge status: Home / Self Care | Payer: PRIVATE HEALTH INSURANCE | Source: Ambulatory Visit | Attending: Family Medicine | Admitting: Family Medicine

## 2014-09-17 ENCOUNTER — Other Ambulatory Visit: Payer: Self-pay | Admitting: Family Medicine

## 2014-09-17 DIAGNOSIS — N6002 Solitary cyst of left breast: Secondary | ICD-10-CM

## 2015-01-22 ENCOUNTER — Ambulatory Visit: Payer: Self-pay | Admitting: Orthopedic Surgery

## 2015-01-22 NOTE — Progress Notes (Signed)
Preoperative surgical orders have been place into the Epic hospital system for Barbara Kirby on 01/22/2015, 3:15 PM  by Mickel Crow for surgery on 02-12-15.  Preop Total Hip - Anterior Approach orders including IV Tylenol, and IV Decadron as long as there are no contraindications to the above medications. Arlee Muslim, PA-C

## 2015-02-07 ENCOUNTER — Encounter (HOSPITAL_COMMUNITY)
Admission: RE | Admit: 2015-02-07 | Discharge: 2015-02-07 | Disposition: A | Discharge status: Home / Self Care | Payer: PRIVATE HEALTH INSURANCE | Source: Ambulatory Visit | Attending: Orthopedic Surgery | Admitting: Orthopedic Surgery

## 2015-02-07 ENCOUNTER — Encounter (HOSPITAL_COMMUNITY): Payer: Self-pay

## 2015-02-07 DIAGNOSIS — Z01818 Encounter for other preprocedural examination: Secondary | ICD-10-CM | POA: Insufficient documentation

## 2015-02-07 DIAGNOSIS — Z0183 Encounter for blood typing: Secondary | ICD-10-CM | POA: Insufficient documentation

## 2015-02-07 DIAGNOSIS — R9431 Abnormal electrocardiogram [ECG] [EKG]: Secondary | ICD-10-CM | POA: Insufficient documentation

## 2015-02-07 DIAGNOSIS — I10 Essential (primary) hypertension: Secondary | ICD-10-CM | POA: Diagnosis not present

## 2015-02-07 DIAGNOSIS — Z01812 Encounter for preprocedural laboratory examination: Secondary | ICD-10-CM | POA: Insufficient documentation

## 2015-02-07 DIAGNOSIS — M1611 Unilateral primary osteoarthritis, right hip: Secondary | ICD-10-CM | POA: Insufficient documentation

## 2015-02-07 HISTORY — DX: Family history of other specified conditions: Z84.89

## 2015-02-07 LAB — COMPREHENSIVE METABOLIC PANEL
ALK PHOS: 50 U/L (ref 38–126)
ALT: 23 U/L (ref 14–54)
AST: 22 U/L (ref 15–41)
Albumin: 4.1 g/dL (ref 3.5–5.0)
Anion gap: 11 (ref 5–15)
BUN: 16 mg/dL (ref 6–20)
CALCIUM: 8.9 mg/dL (ref 8.9–10.3)
CO2: 25 mmol/L (ref 22–32)
CREATININE: 0.74 mg/dL (ref 0.44–1.00)
Chloride: 101 mmol/L (ref 101–111)
GFR calc non Af Amer: >60 mL/min (ref >60)
Glucose, Bld: 94 mg/dL (ref 65–99)
Potassium: 3.9 mmol/L (ref 3.5–5.1)
SODIUM: 137 mmol/L (ref 135–145)
Total Bilirubin: 1.2 mg/dL (ref 0.3–1.2)
Total Protein: 7.4 g/dL (ref 6.5–8.1)

## 2015-02-07 LAB — URINALYSIS, ROUTINE W REFLEX MICROSCOPIC
BILIRUBIN URINE: NEGATIVE
GLUCOSE, UA: NEGATIVE mg/dL
HGB URINE DIPSTICK: NEGATIVE
KETONES UR: NEGATIVE mg/dL
Leukocytes, UA: NEGATIVE
Nitrite: POSITIVE — AB
PROTEIN: NEGATIVE mg/dL
Specific Gravity, Urine: 1.02 (ref 1.005–1.030)
pH: 5.5 (ref 5.0–8.0)

## 2015-02-07 LAB — SURGICAL PCR SCREEN
MRSA, PCR: NEGATIVE
STAPHYLOCOCCUS AUREUS: NEGATIVE

## 2015-02-07 LAB — CBC
HCT: 43 % (ref 36.0–46.0)
HEMOGLOBIN: 14.4 g/dL (ref 12.0–15.0)
MCH: 31.2 pg (ref 26.0–34.0)
MCHC: 33.5 g/dL (ref 30.0–36.0)
MCV: 93.3 fL (ref 78.0–100.0)
Platelets: 259 10*3/uL (ref 150–400)
RBC: 4.61 MIL/uL (ref 3.87–5.11)
RDW: 13.2 % (ref 11.5–15.5)
WBC: 7.6 10*3/uL (ref 4.0–10.5)

## 2015-02-07 LAB — URINE MICROSCOPIC-ADD ON

## 2015-02-07 LAB — APTT: aPTT: 30 seconds (ref 24–37)

## 2015-02-07 LAB — PROTIME-INR
INR: 1.03 (ref 0.00–1.49)
Prothrombin Time: 13.7 seconds (ref 11.6–15.2)

## 2015-02-07 NOTE — Patient Instructions (Addendum)
Barbara Kirby  02/07/2015   Your procedure is scheduled on: Wednesday 02-12-15  Report to [facility]  Entrance take [facility]  elevators to 3rd floor to  [place] at 6:30AM.  Call this number if you have problems the morning of surgery [phone]   Remember: ONLY 1 PERSON MAY GO WITH YOU TO SHORT STAY TO GET  READY MORNING OF [place].  Do not eat food or drink liquids :After Midnight.     Take these medicines the morning of surgery with A SIP OF WATER: NONE DO NOT TAKE ANY DIABETIC MEDICATIONS DAY OF YOUR SURGERY                               You may not have any metal on your body including hair pins and              piercings  Do not wear jewelry, make-up, lotions, powders or perfumes, deodorant             Do not wear nail polish.  Do not shave  48 hours prior to surgery.              Men may shave face and neck.   Do not bring valuables to the hospital. [place].  Contacts, dentures or bridgework may not be worn into surgery.  Leave suitcase in the car. After surgery it may be brought to your room.     Special Instructions: N/A              Please read over the following fact sheets you were given: _____________________________________________________________________             [facility] - Preparing for Surgery Before surgery, you can play an important role.  Because skin is not sterile, your skin needs to be as free of germs as possible.  You can reduce the number of germs on your skin by washing with CHG (chlorahexidine gluconate) soap before surgery.  CHG is an antiseptic cleaner which kills germs and bonds with the skin to continue killing germs even after washing. Please DO NOT use if you have an allergy to CHG or antibacterial soaps.  If your skin becomes reddened/irritated stop using the CHG and inform your nurse when you arrive at Short Stay. Do not shave (including legs  and underarms) for at least 48 hours prior to the first CHG shower.  You may shave your face/neck. Please follow these instructions carefully:  1.  Shower with CHG Soap the night before surgery and the  morning of Surgery.  2.  If you choose to wash your hair, wash your hair first as usual with your  normal  shampoo.  3.  After you shampoo, rinse your hair and body thoroughly to remove the  shampoo.                           4.  Use CHG as you would any other liquid soap.  You can apply chg directly  to the skin and wash                       Gently with  a scrungie or clean washcloth.  5.  Apply the CHG Soap to your body ONLY FROM THE NECK DOWN.   Do not use on face/ open                           Wound or open sores. Avoid contact with eyes, ears mouth and genitals (private parts).                       Wash face,  Genitals (private parts) with your normal soap.             6.  Wash thoroughly, paying special attention to the area where your surgery  will be performed.  7.  Thoroughly rinse your body with warm water from the neck down.  8.  DO NOT shower/wash with your normal soap after using and rinsing off  the CHG Soap.                9.  Pat yourself dry with a clean towel.            10.  Wear clean pajamas.            11.  Place clean sheets on your bed the night of your first shower and do not  sleep with pets. Day of Surgery : Do not apply any lotions/deodorants the morning of surgery.  Please wear clean clothes to the hospital/surgery center.  FAILURE TO FOLLOW THESE INSTRUCTIONS MAY RESULT IN THE CANCELLATION OF YOUR SURGERY PATIENT SIGNATURE_________________________________  NURSE SIGNATURE__________________________________  ________________________________________________________________________   Adam Phenix  An incentive spirometer is a tool that can help keep your lungs clear and active. This tool measures how well you are filling your lungs with each breath.  Taking long deep breaths may help reverse or decrease the chance of developing breathing (pulmonary) problems (especially infection) following:  A long period of time when you are unable to move or be active. BEFORE THE PROCEDURE   If the spirometer includes an indicator to show your best effort, your nurse or respiratory therapist will set it to a desired goal.  If possible, sit up straight or lean slightly forward. Try not to slouch.  Hold the incentive spirometer in an upright position. INSTRUCTIONS FOR USE   Sit on the edge of your bed if possible, or sit up as far as you can in bed or on a chair.  Hold the incentive spirometer in an upright position.  Breathe out normally.  Place the mouthpiece in your mouth and seal your lips tightly around it.  Breathe in slowly and as deeply as possible, raising the piston or the ball toward the top of the column.  Hold your breath for 3-5 seconds or for as long as possible. Allow the piston or ball to fall to the bottom of the column.  Remove the mouthpiece from your mouth and breathe out normally.  Rest for a few seconds and repeat Steps 1 through 7 at least 10 times every 1-2 hours when you are awake. Take your time and take a few normal breaths between deep breaths.  The spirometer may include an indicator to show your best effort. Use the indicator as a goal to work toward during each repetition.  After each set of 10 deep breaths, practice coughing to be sure your lungs are clear. If you have an incision (the cut made at the time of surgery), support your  incision when coughing by placing a pillow or rolled up towels firmly against it. Once you are able to get out of bed, walk around indoors and cough well. You may stop using the incentive spirometer when instructed by your caregiver.  RISKS AND COMPLICATIONS  Take your time so you do not get dizzy or light-headed.  If you are in pain, you may need to take or ask for pain medication  before doing incentive spirometry. It is harder to take a deep breath if you are having pain. AFTER USE  Rest and breathe slowly and easily.  It can be helpful to keep track of a log of your progress. Your caregiver can provide you with a simple table to help with this. If you are using the spirometer at home, follow these instructions: [place] IF:   You are having difficultly using the spirometer.  You have trouble using the spirometer as often as instructed.  Your pain medication is not giving enough relief while using the spirometer.  You develop fever of 100.5 F (38.1 C) or higher. SEEK IMMEDIATE MEDICAL CARE IF:   You cough up bloody sputum that had not been present before.  You develop fever of 102 F (38.9 C) or greater.  You develop worsening pain at or near the incision site. MAKE SURE YOU:   Understand these instructions.  Will watch your condition.  Will get help right away if you are not doing well or get worse. Document Released: 05/24/2006 Document Revised: 04/05/2011 Document Reviewed: 07/25/2006 [employer] Patient Information 2014 [place], [place].   ________________________________________________________________________  WHAT IS A BLOOD TRANSFUSION? Blood Transfusion Information  A transfusion is the replacement of blood or some of its parts. Blood is made up of multiple cells which provide different functions.  Red blood cells carry oxygen and are used for blood loss replacement.  White blood cells fight against infection.  Platelets control bleeding.  Plasma helps clot blood.  Other blood products are available for specialized needs, such as hemophilia or other clotting disorders. BEFORE THE TRANSFUSION  Who gives blood for transfusions?   Healthy volunteers who are fully evaluated to make sure their blood is safe. This is blood bank blood. Transfusion therapy is the safest it has ever been in the practice of medicine. Before blood is  taken from a donor, a complete history is taken to make sure that person has no history of diseases nor engages in risky social behavior (examples are intravenous drug use or sexual activity with multiple partners). The donor's travel history is screened to minimize risk of transmitting infections, such as malaria. The donated blood is tested for signs of infectious diseases, such as HIV and hepatitis. The blood is then tested to be sure it is compatible with you in order to minimize the chance of a transfusion reaction. If you or a relative donates blood, this is often done in anticipation of surgery and is not appropriate for emergency situations. It takes many days to process the donated blood. RISKS AND COMPLICATIONS Although transfusion therapy is very safe and saves many lives, the main dangers of transfusion include:   Getting an infectious disease.  Developing a transfusion reaction. This is an allergic reaction to something in the blood you were given. Every precaution is taken to prevent this. The decision to have a blood transfusion has been considered carefully by your caregiver before blood is given. Blood is not given unless the benefits outweigh the risks. AFTER THE TRANSFUSION  Right  after receiving a blood transfusion, you will usually feel much better and more energetic. This is especially true if your red blood cells have gotten low (anemic). The transfusion raises the level of the red blood cells which carry oxygen, and this usually causes an energy increase.  The nurse administering the transfusion will monitor you carefully for complications. HOME CARE INSTRUCTIONS  No special instructions are needed after a transfusion. You may find your energy is better. Speak with your caregiver about any limitations on activity for underlying diseases you may have. SEEK MEDICAL CARE IF:   Your condition is not improving after your transfusion.  You develop redness or irritation at the  intravenous (IV) site. SEEK IMMEDIATE MEDICAL CARE IF:  Any of the following symptoms occur over the next 12 hours:  Shaking chills.  You have a temperature by mouth above 102 F (38.9 C), not controlled by medicine.  Chest, back, or muscle pain.  People around you feel you are not acting correctly or are confused.  Shortness of breath or difficulty breathing.  Dizziness and fainting.  You get a rash or develop hives.  You have a decrease in urine output.  Your urine turns a dark color or changes to pink, red, or brown. Any of the following symptoms occur over the next 10 days:  You have a temperature by mouth above 102 F (38.9 C), not controlled by medicine.  Shortness of breath.  Weakness after normal activity.  The white part of the eye turns yellow (jaundice).  You have a decrease in the amount of urine or are urinating less often.  Your urine turns a dark color or changes to pink, red, or brown. Document Released: 01/09/2000 Document Revised: 04/05/2011 Document Reviewed: 08/28/2007 [facility] Patient Information 2014 [place], [place].  _______________________________________________________________________

## 2015-02-07 NOTE — Pre-Procedure Instructions (Signed)
Medical clearance, Dr. Gar Ponto, on chart Echo 11-12-2013 on chart

## 2015-02-09 ENCOUNTER — Ambulatory Visit: Payer: Self-pay | Admitting: Orthopedic Surgery

## 2015-02-09 NOTE — H&P (Signed)
Barbara Kirby DOB: [birth year] Married / Language: English / Race: White Female Date of Admission:  02/12/2015 CC:  Right Hip Pain History of Present Illness  The patient is a 60 year old female who comes in for a preoperative History and Physical. The patient is scheduled for a right total hip arthroplasty (anterior) to be performed by Dr. Dione Plover. Aluisio, MD at [facility] on 02/12/2015. The patient is a 60 year old female presenting for continue care with regards to her hips.. The patient comes in about a year out from left total hip arthroplasty (anterior approach). The patient states that she is doing well with regards tothe left hip at this time. The pain is under excellent control at this time. They are currently on Tylenol (prn. and tramodol) for their pain. The patient feels that they are progressing well at this time, however the right hip is now hurting. Radiographs taken in the office of the left hip showed a left prosthesis in excellent position with no periprosthetic abnormalities. On the right, she has bone-on-bone arthritis with essentially no visible joint space. She said the left hip is doing great at this time. She is really pleased with how that has done. Her right hip is that has given her most problem now. It is gotten progressively worse over the past six months. It is hurting at all times. It is starting to limit what she can and cannot do. It is getting to be as bad as the left one was before she had it replaced. She has got end stage arthritis of the right hip. She is at a stage where she wants to go ahead and get it fixed. She is not interested in cortisone injection because she has had a bad reaction to cortisone in the past. She is ready to proceed with surgical intervention of the right hip. They have been treated conservatively in the past for the above stated problem and despite conservative measures, they continue to have progressive pain and severe functional  limitations and dysfunction. They have failed non-operative management including home exercise, medications. It is felt that they would benefit from undergoing total joint replacement. Risks and benefits of the procedure have been discussed with the patient and they elect to proceed with surgery. There are no active contraindications to surgery such as ongoing infection or rapidly progressive neurological disease.   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)  Osteoarthritis of left hip (M16.12)   Status post hip replacement, left [id number]  Primary osteoarthritis of right hip (M16.11)  Gastroesophageal Reflux Disease  High blood pressure  Gastroesophageal Reflux Disease  High blood pressure  Chronic Pain  Vertigo  Impaired Vision  wears glasses Degenerative Disc Disease  Menopause  Mumps  Bronchitis  Past History Arrhythmia  rare occurence Chronic Pain  Vertigo  Impaired Vision  wears glasses Degenerative Disc Disease  Menopause  Mumps  Bronchitis  Past History Arrhythmia  rare occurrence  Allergies Codeine Phosphate *ANALGESICS - OPIOID*  Nausea. PredniSONE *CORTICOSTEROIDS*  Rapid pulse.  Family History Hypertension  Mother. Heart disease in female family member before age 19  Heart Disease  Maternal Grandfather, Mother. Osteoporosis  Mother. Osteoarthritis  Mother. Kidney disease  Mother, Paternal Jon Gills. Chronic Obstructive Lung Disease  Father, Mother. Cerebrovascular Accident  Mother. Cancer  Father, Maternal Grandmother, Paternal Grandfather. First Degree Relatives  reported Diabetes Mellitus  Mother. Congestive Heart Failure  Mother. Autoimmune disorder  Paternal Grandmother.  Social History  Current occupation  Arboriculturist  Current work status  working full time Marital status  married Living situation  live with spouse Children  1 Exercise  Exercises weekly;  does other Never consumed alcohol  06/19/2013: Never consumed alcohol No history of drug/alcohol rehab  Not under pain contract  Number of flights of stairs before winded  1 Tobacco / smoke exposure  06/19/2013: no Tobacco use  Never smoker. 06/19/2013  Medication History Aspirin EC (81MG  Tablet DR, Oral) Active. Effexor XR (150MG  Capsule ER 24HR, Oral) Active. Gabapentin (300MG  Capsule, Oral) Active. Triamterene-HCTZ (37.5-25MG  Tablet, 1/2 Oral daily) Active. Toprol XL (50MG  Tablet ER 24HR, Oral) Active. Amitriptyline HCl (25MG  Tablet, 1 (one) Tablet Oral po qhs, Taken starting 05/17/2014) Active. Vimovo (500-20MG  Tablet DR, Oral) Active. Tylenol Extra Strength (500MG  Tablet, Oral as needed) Active. TraMADol HCl (50MG  Tablet, Oral) Active.  Past Surgical History Spinal Surgery  Tonsillectomy  Dilation and Curettage of Uterus - Multiple  Spinal Fusion  lower back Total Hip Replacement - Left  Date: 2015.   Review of Systems General Not Present- Chills, Fatigue, Fever, Memory Loss, Night Sweats, Weight Gain and Weight Loss. Skin Not Present- Eczema, Hives, Itching, Lesions and Rash. HEENT Not Present- Dentures, Double Vision, Headache, Hearing Loss, Tinnitus and Visual Loss. Respiratory Not Present- Allergies, Chronic Cough, Coughing up blood, Shortness of breath at rest and Shortness of breath with exertion. Cardiovascular Not Present- Chest Pain, Difficulty Breathing Lying Down, Murmur, Palpitations, Racing/skipping heartbeats and Swelling. Gastrointestinal Not Present- Abdominal Pain, Bloody Stool, Constipation, Diarrhea, Difficulty Swallowing, Heartburn, Jaundice, Loss of appetitie, Nausea and Vomiting. Female Genitourinary Not Present- Blood in Urine, Discharge, Flank Pain, Incontinence, Painful Urination, Urgency, Urinary frequency, Urinary Retention, Urinating at Night and Weak urinary stream. Musculoskeletal Not Present- Back Pain, Joint Pain, Joint  Swelling, Morning Stiffness, Muscle Pain, Muscle Weakness and Spasms. Neurological Not Present- Blackout spells, Difficulty with balance, Dizziness, Paralysis, Tremor and Weakness. Psychiatric Not Present- Insomnia.  Vitals Weight: 228 lb Height: 68in Weight was reported by patient. Height was reported by patient. Body Surface Area: 2.16 m Body Mass Index: 34.67 kg/m  BP: 126/72 (Sitting, Right Arm, Standard)  Physical Exam  General Mental Status -Alert, cooperative and good historian. General Appearance-pleasant, Not in acute distress. Orientation-Oriented X3. Build & Nutrition-Well nourished and Well developed.  Head and Neck Head-normocephalic, atraumatic . Neck Global Assessment - supple, no bruit auscultated on the right, no bruit auscultated on the left.  Eye Vision-Wears corrective lenses. Pupil - Bilateral-Regular and Round. Motion - Bilateral-EOMI.  Chest and Lung Exam Auscultation Breath sounds - clear at anterior chest wall and clear at posterior chest wall. Adventitious sounds - No Adventitious sounds.  Cardiovascular Auscultation Rhythm - Regular rate and rhythm. Heart Sounds - S1 WNL and S2 WNL. Murmurs & Other Heart Sounds - Auscultation of the heart reveals - No Murmurs.  Abdomen Palpation/Percussion Tenderness - Abdomen is non-tender to palpation. Rigidity (guarding) - Abdomen is soft. Auscultation Auscultation of the abdomen reveals - Bowel sounds normal.  Female Genitourinary Note: Not done, not pertinent to present illness   Musculoskeletal Note: On exam, she is alert and oriented, in no apparent distress. Her left hip can flexed to about 120, rotated in 30, out 40, abducted 40 without discomfort. Right hip is flexion about 100, no internal rotation, about 10 external rotation, and 10 abduction with discomfort. She is walking with an antalgic gait pattern on the right side.  RADIOGRAPHS AP pelvis, AP and lateral of the  left and lateral of the right showed a left  prosthesis in excellent position with no periprosthetic abnormalities. On the right, she has bone-on-bone arthritis with essentially no visible joint space.  Assessment & Plan Primary osteoarthritis of right hip (M16.11) Status post hip replacement, left [id number]  Note:Surgical Plans: Right Total Hip Replacement - Anterior Approach  Disposition: Home  PCP: Dr. Gar Ponto - Patient has been seen preoperatively and felt to be stable for surgery.  IV TXA  Anesthesia Issues: None  Signed electronically by Joelene Millin, III PA-C

## 2015-02-10 NOTE — Progress Notes (Signed)
Faxed note from Dr. Wynelle Link about Urine results sent to him- patient to take Cipro.

## 2015-02-12 ENCOUNTER — Encounter (HOSPITAL_COMMUNITY): Payer: Self-pay | Admitting: Certified Registered Nurse Anesthetist

## 2015-02-12 ENCOUNTER — Encounter (HOSPITAL_COMMUNITY)
Admission: RE | Disposition: A | Discharge status: Home Health | Payer: Self-pay | Source: Ambulatory Visit | Attending: Orthopedic Surgery

## 2015-02-12 ENCOUNTER — Inpatient Hospital Stay (HOSPITAL_COMMUNITY): Payer: PRIVATE HEALTH INSURANCE | Admitting: Certified Registered Nurse Anesthetist

## 2015-02-12 ENCOUNTER — Inpatient Hospital Stay (HOSPITAL_COMMUNITY): Payer: PRIVATE HEALTH INSURANCE

## 2015-02-12 ENCOUNTER — Inpatient Hospital Stay (HOSPITAL_COMMUNITY)
Admission: RE | Admit: 2015-02-12 | Discharge: 2015-02-13 | DRG: 470 | Disposition: A | Discharge status: Home Health | Payer: PRIVATE HEALTH INSURANCE | Source: Ambulatory Visit | Attending: Orthopedic Surgery | Admitting: Orthopedic Surgery

## 2015-02-12 DIAGNOSIS — Z79899 Other long term (current) drug therapy: Secondary | ICD-10-CM | POA: Diagnosis not present

## 2015-02-12 DIAGNOSIS — Z96649 Presence of unspecified artificial hip joint: Secondary | ICD-10-CM

## 2015-02-12 DIAGNOSIS — M25551 Pain in right hip: Secondary | ICD-10-CM | POA: Diagnosis present

## 2015-02-12 DIAGNOSIS — Z981 Arthrodesis status: Secondary | ICD-10-CM

## 2015-02-12 DIAGNOSIS — M1611 Unilateral primary osteoarthritis, right hip: Principal | ICD-10-CM | POA: Diagnosis present

## 2015-02-12 DIAGNOSIS — Z01812 Encounter for preprocedural laboratory examination: Secondary | ICD-10-CM

## 2015-02-12 DIAGNOSIS — K219 Gastro-esophageal reflux disease without esophagitis: Secondary | ICD-10-CM | POA: Diagnosis present

## 2015-02-12 DIAGNOSIS — Z7982 Long term (current) use of aspirin: Secondary | ICD-10-CM

## 2015-02-12 DIAGNOSIS — Z96642 Presence of left artificial hip joint: Secondary | ICD-10-CM | POA: Diagnosis present

## 2015-02-12 DIAGNOSIS — I10 Essential (primary) hypertension: Secondary | ICD-10-CM | POA: Diagnosis present

## 2015-02-12 DIAGNOSIS — M169 Osteoarthritis of hip, unspecified: Secondary | ICD-10-CM | POA: Diagnosis present

## 2015-02-12 HISTORY — PX: TOTAL HIP ARTHROPLASTY: SHX124

## 2015-02-12 LAB — TYPE AND SCREEN
ABO/RH(D): A POS
Antibody Screen: NEGATIVE

## 2015-02-12 SURGERY — ARTHROPLASTY, HIP, TOTAL, ANTERIOR APPROACH
Anesthesia: Spinal | Site: Hip | Laterality: Right

## 2015-02-12 MED ORDER — ACETAMINOPHEN 10 MG/ML IV SOLN
INTRAVENOUS | Status: DC | PRN
  Administered 2015-02-12: 1000 mg via INTRAVENOUS

## 2015-02-12 MED ORDER — METHOCARBAMOL 500 MG PO TABS: 500.0000 mg | ORAL_TABLET | Freq: Four times a day (QID) | ORAL | Status: DC | PRN

## 2015-02-12 MED ORDER — EPHEDRINE SULFATE 50 MG/ML IJ SOLN
INTRAMUSCULAR | Status: DC | PRN
  Administered 2015-02-12 (×6): 5 mg via INTRAVENOUS
  Administered 2015-02-12: 10 mg via INTRAVENOUS
  Administered 2015-02-12: 5 mg via INTRAVENOUS

## 2015-02-12 MED ORDER — MIDAZOLAM HCL 2 MG/2ML IJ SOLN
INTRAMUSCULAR | Status: AC
  Filled 2015-02-12: qty 2

## 2015-02-12 MED ORDER — FENTANYL CITRATE (PF) 100 MCG/2ML IJ SOLN
INTRAMUSCULAR | Status: AC
  Filled 2015-02-12: qty 2

## 2015-02-12 MED ORDER — CEFAZOLIN SODIUM-DEXTROSE 2-3 GM-% IV SOLR
2.0000 g | Freq: Four times a day (QID) | INTRAVENOUS | Status: AC
  Administered 2015-02-12 (×2): 2 g via INTRAVENOUS
  Filled 2015-02-12 (×2): qty 50

## 2015-02-12 MED ORDER — DEXAMETHASONE SODIUM PHOSPHATE 10 MG/ML IJ SOLN: 10.0000 mg | Freq: Once | INTRAMUSCULAR | Status: DC

## 2015-02-12 MED ORDER — LACTATED RINGERS IV SOLN
INTRAVENOUS | Status: DC | PRN
  Administered 2015-02-12 (×3): via INTRAVENOUS

## 2015-02-12 MED ORDER — ONDANSETRON HCL 4 MG/2ML IJ SOLN: 4.0000 mg | Freq: Four times a day (QID) | INTRAMUSCULAR | Status: DC | PRN

## 2015-02-12 MED ORDER — ACETAMINOPHEN 500 MG PO TABS: 1000.0000 mg | ORAL_TABLET | Freq: Four times a day (QID) | ORAL | Status: DC

## 2015-02-12 MED ORDER — BUPIVACAINE HCL (PF) 0.25 % IJ SOLN
INTRAMUSCULAR | Status: AC
  Filled 2015-02-12: qty 30

## 2015-02-12 MED ORDER — KETOROLAC TROMETHAMINE 15 MG/ML IJ SOLN
INTRAMUSCULAR | Status: AC
  Filled 2015-02-12: qty 1

## 2015-02-12 MED ORDER — BUPIVACAINE IN DEXTROSE 0.75-8.25 % IT SOLN
INTRATHECAL | Status: DC | PRN
  Administered 2015-02-12: 2 mL via INTRATHECAL

## 2015-02-12 MED ORDER — KCL IN DEXTROSE-NACL 20-5-0.9 MEQ/L-%-% IV SOLN
INTRAVENOUS | Status: DC
  Administered 2015-02-12: 16:00:00 via INTRAVENOUS
  Filled 2015-02-12 (×2): qty 1000

## 2015-02-12 MED ORDER — MIDAZOLAM HCL 5 MG/5ML IJ SOLN
INTRAMUSCULAR | Status: DC | PRN
Start: 2015-02-12 — End: 2015-02-12
  Administered 2015-02-12 (×3): 1 mg via INTRAVENOUS

## 2015-02-12 MED ORDER — CHLORHEXIDINE GLUCONATE 4 % EX LIQD: 60.0000 mL | Freq: Once | CUTANEOUS | Status: DC

## 2015-02-12 MED ORDER — ACETAMINOPHEN 500 MG PO TABS
1000.0000 mg | ORAL_TABLET | Freq: Four times a day (QID) | ORAL | Status: DC
  Administered 2015-02-12 – 2015-02-13 (×3): 1000 mg via ORAL
  Filled 2015-02-12 (×8): qty 2

## 2015-02-12 MED ORDER — HYDROMORPHONE HCL 2 MG PO TABS: 2.0000 mg | ORAL_TABLET | ORAL | Status: DC | PRN

## 2015-02-12 MED ORDER — PROMETHAZINE HCL 25 MG/ML IJ SOLN: 6.2500 mg | INTRAMUSCULAR | Status: DC | PRN

## 2015-02-12 MED ORDER — ACETAMINOPHEN 325 MG PO TABS: 650.0000 mg | ORAL_TABLET | Freq: Four times a day (QID) | ORAL | Status: DC | PRN

## 2015-02-12 MED ORDER — BISACODYL 10 MG RE SUPP: 10.0000 mg | Freq: Every day | RECTAL | Status: DC | PRN

## 2015-02-12 MED ORDER — ONDANSETRON HCL 4 MG/2ML IJ SOLN
INTRAMUSCULAR | Status: AC
  Filled 2015-02-12: qty 2

## 2015-02-12 MED ORDER — POLYETHYLENE GLYCOL 3350 17 G PO PACK: 17.0000 g | PACK | Freq: Every day | ORAL | Status: DC | PRN

## 2015-02-12 MED ORDER — CEFAZOLIN SODIUM-DEXTROSE 2-3 GM-% IV SOLR
2.0000 g | INTRAVENOUS | Status: AC
  Administered 2015-02-12: 2 g via INTRAVENOUS

## 2015-02-12 MED ORDER — DEXAMETHASONE SODIUM PHOSPHATE 10 MG/ML IJ SOLN
INTRAMUSCULAR | Status: AC
  Filled 2015-02-12: qty 1

## 2015-02-12 MED ORDER — PROPOFOL 500 MG/50ML IV EMUL
INTRAVENOUS | Status: DC | PRN
  Administered 2015-02-12: 150 ug via INTRAVENOUS

## 2015-02-12 MED ORDER — TRIAMTERENE-HCTZ 37.5-25 MG PO TABS
0.5000 | ORAL_TABLET | Freq: Every day | ORAL | Status: DC
  Administered 2015-02-12: 0.5 via ORAL
  Filled 2015-02-12 (×2): qty 0.5

## 2015-02-12 MED ORDER — BUPIVACAINE HCL (PF) 0.25 % IJ SOLN
INTRAMUSCULAR | Status: DC | PRN
  Administered 2015-02-12: 30 mL

## 2015-02-12 MED ORDER — RIVAROXABAN 10 MG PO TABS
10.0000 mg | ORAL_TABLET | Freq: Every day | ORAL | Status: DC
  Administered 2015-02-13: 10 mg via ORAL
  Filled 2015-02-12 (×2): qty 1

## 2015-02-12 MED ORDER — HYDROMORPHONE HCL 1 MG/ML IJ SOLN
INTRAMUSCULAR | Status: AC
  Filled 2015-02-12: qty 1

## 2015-02-12 MED ORDER — METOPROLOL SUCCINATE ER 50 MG PO TB24
50.0000 mg | ORAL_TABLET | Freq: Every day | ORAL | Status: DC
  Administered 2015-02-12: 50 mg via ORAL
  Filled 2015-02-12 (×2): qty 1

## 2015-02-12 MED ORDER — DEXTROSE 5 % IV SOLN
500.0000 mg | Freq: Four times a day (QID) | INTRAVENOUS | Status: DC | PRN
  Administered 2015-02-12: 500 mg via INTRAVENOUS
  Filled 2015-02-12 (×2): qty 5

## 2015-02-12 MED ORDER — HYDROMORPHONE HCL 1 MG/ML IJ SOLN: 0.5000 mg | INTRAMUSCULAR | Status: DC | PRN

## 2015-02-12 MED ORDER — DOCUSATE SODIUM 100 MG PO CAPS
100.0000 mg | ORAL_CAPSULE | Freq: Two times a day (BID) | ORAL | Status: DC
  Administered 2015-02-12 – 2015-02-13 (×2): 100 mg via ORAL

## 2015-02-12 MED ORDER — TRAMADOL HCL 50 MG PO TABS
50.0000 mg | ORAL_TABLET | Freq: Four times a day (QID) | ORAL | Status: DC | PRN
  Administered 2015-02-12 – 2015-02-13 (×3): 100 mg via ORAL
  Filled 2015-02-12 (×4): qty 2

## 2015-02-12 MED ORDER — METOCLOPRAMIDE HCL 10 MG PO TABS: 5.0000 mg | ORAL_TABLET | Freq: Three times a day (TID) | ORAL | Status: DC | PRN

## 2015-02-12 MED ORDER — FENTANYL CITRATE (PF) 100 MCG/2ML IJ SOLN
25.0000 ug | INTRAMUSCULAR | Status: DC | PRN
  Administered 2015-02-12 (×3): 50 ug via INTRAVENOUS

## 2015-02-12 MED ORDER — MENTHOL 3 MG MT LOZG: 1.0000 | LOZENGE | OROMUCOSAL | Status: DC | PRN

## 2015-02-12 MED ORDER — ACETAMINOPHEN 10 MG/ML IV SOLN
INTRAVENOUS | Status: AC
  Filled 2015-02-12: qty 100

## 2015-02-12 MED ORDER — GABAPENTIN 300 MG PO CAPS
300.0000 mg | ORAL_CAPSULE | Freq: Every day | ORAL | Status: DC
  Administered 2015-02-12: 300 mg via ORAL
  Filled 2015-02-12 (×2): qty 1

## 2015-02-12 MED ORDER — DEXAMETHASONE SODIUM PHOSPHATE 10 MG/ML IJ SOLN
10.0000 mg | Freq: Once | INTRAMUSCULAR | Status: DC
  Filled 2015-02-12: qty 1

## 2015-02-12 MED ORDER — ONDANSETRON HCL 4 MG PO TABS: 4.0000 mg | ORAL_TABLET | Freq: Four times a day (QID) | ORAL | Status: DC | PRN

## 2015-02-12 MED ORDER — CEFAZOLIN SODIUM-DEXTROSE 2-3 GM-% IV SOLR
INTRAVENOUS | Status: AC
  Filled 2015-02-12: qty 50

## 2015-02-12 MED ORDER — SODIUM CHLORIDE 0.9 % IV SOLN
1000.0000 mg | INTRAVENOUS | Status: AC
  Administered 2015-02-12: 1000 mg via INTRAVENOUS
  Filled 2015-02-12: qty 10

## 2015-02-12 MED ORDER — DIPHENHYDRAMINE HCL 12.5 MG/5ML PO ELIX: 12.5000 mg | ORAL_SOLUTION | ORAL | Status: DC | PRN

## 2015-02-12 MED ORDER — PROPOFOL 10 MG/ML IV BOLUS
INTRAVENOUS | Status: AC
  Filled 2015-02-12: qty 40

## 2015-02-12 MED ORDER — DEXAMETHASONE SODIUM PHOSPHATE 10 MG/ML IJ SOLN
INTRAMUSCULAR | Status: DC | PRN
  Administered 2015-02-12: 10 mg via INTRAVENOUS

## 2015-02-12 MED ORDER — ACETAMINOPHEN 10 MG/ML IV SOLN: 1000.0000 mg | Freq: Once | INTRAVENOUS | Status: DC

## 2015-02-12 MED ORDER — PHENOL 1.4 % MT LIQD
1.0000 | OROMUCOSAL | Status: DC | PRN
Start: 2015-02-12 — End: 2015-02-13

## 2015-02-12 MED ORDER — FENTANYL CITRATE (PF) 100 MCG/2ML IJ SOLN
INTRAMUSCULAR | Status: DC | PRN
  Administered 2015-02-12 (×4): 50 ug via INTRAVENOUS

## 2015-02-12 MED ORDER — ACETAMINOPHEN 650 MG RE SUPP: 650.0000 mg | Freq: Four times a day (QID) | RECTAL | Status: DC | PRN

## 2015-02-12 MED ORDER — ONDANSETRON HCL 4 MG/2ML IJ SOLN
INTRAMUSCULAR | Status: DC | PRN
  Administered 2015-02-12: 4 mg via INTRAVENOUS

## 2015-02-12 MED ORDER — VENLAFAXINE HCL ER 150 MG PO CP24
150.0000 mg | ORAL_CAPSULE | Freq: Every day | ORAL | Status: DC
  Administered 2015-02-12: 150 mg via ORAL
  Filled 2015-02-12 (×2): qty 1

## 2015-02-12 MED ORDER — PROPOFOL 500 MG/50ML IV EMUL
INTRAVENOUS | Status: DC | PRN
  Administered 2015-02-12: 100 ug/kg/min via INTRAVENOUS

## 2015-02-12 MED ORDER — FLEET ENEMA 7-19 GM/118ML RE ENEM: 1.0000 | ENEMA | Freq: Once | RECTAL | Status: DC | PRN

## 2015-02-12 MED ORDER — KETOROLAC TROMETHAMINE 15 MG/ML IJ SOLN
7.5000 mg | Freq: Four times a day (QID) | INTRAMUSCULAR | Status: DC | PRN
  Administered 2015-02-12 – 2015-02-13 (×3): 7.5 mg via INTRAVENOUS
  Filled 2015-02-12 (×2): qty 1

## 2015-02-12 MED ORDER — METOCLOPRAMIDE HCL 5 MG/ML IJ SOLN: 5.0000 mg | Freq: Three times a day (TID) | INTRAMUSCULAR | Status: DC | PRN

## 2015-02-12 MED ORDER — SODIUM CHLORIDE 0.9 % IV SOLN: INTRAVENOUS | Status: DC

## 2015-02-12 SURGICAL SUPPLY — 34 items
BAG DECANTER FOR FLEXI CONT (MISCELLANEOUS) ×3 IMPLANT
BAG ZIPLOCK 12X15 (MISCELLANEOUS) IMPLANT
BLADE SAG 18X100X1.27 (BLADE) ×3 IMPLANT
CAPT HIP TOTAL 2 ×3 IMPLANT
CLOSURE WOUND 1/2 X4 (GAUZE/BANDAGES/DRESSINGS) ×1
CLOTH BEACON ORANGE TIMEOUT ST (SAFETY) ×3 IMPLANT
COVER PERINEAL POST (MISCELLANEOUS) ×3 IMPLANT
DECANTER SPIKE VIAL GLASS SM (MISCELLANEOUS) ×3 IMPLANT
DRAPE STERI IOBAN 125X83 (DRAPES) ×3 IMPLANT
DRAPE U-SHAPE 47X51 STRL (DRAPES) ×6 IMPLANT
DRSG ADAPTIC 3X8 NADH LF (GAUZE/BANDAGES/DRESSINGS) ×3 IMPLANT
DRSG MEPILEX BORDER 4X4 (GAUZE/BANDAGES/DRESSINGS) ×3 IMPLANT
DRSG MEPILEX BORDER 4X8 (GAUZE/BANDAGES/DRESSINGS) ×3 IMPLANT
DURAPREP 26ML APPLICATOR (WOUND CARE) ×3 IMPLANT
ELECT REM PT RETURN 9FT ADLT (ELECTROSURGICAL) ×3
ELECTRODE REM PT RTRN 9FT ADLT (ELECTROSURGICAL) ×1 IMPLANT
EVACUATOR 1/8 PVC DRAIN (DRAIN) ×3 IMPLANT
GLOVE BIO SURGEON STRL SZ7.5 (GLOVE) ×3 IMPLANT
GLOVE BIO SURGEON STRL SZ8 (GLOVE) ×6 IMPLANT
GLOVE BIOGEL PI IND STRL 8 (GLOVE) ×2 IMPLANT
GLOVE BIOGEL PI INDICATOR 8 (GLOVE) ×4
GOWN STRL REUS W/TWL LRG LVL3 (GOWN DISPOSABLE) ×3 IMPLANT
GOWN STRL REUS W/TWL XL LVL3 (GOWN DISPOSABLE) ×3 IMPLANT
PACK ANTERIOR HIP CUSTOM (KITS) ×3 IMPLANT
STRIP CLOSURE SKIN 1/2X4 (GAUZE/BANDAGES/DRESSINGS) ×2 IMPLANT
SUT ETHIBOND NAB CT1 #1 30IN (SUTURE) ×3 IMPLANT
SUT MNCRL AB 4-0 PS2 18 (SUTURE) ×3 IMPLANT
SUT VIC AB 2-0 CT1 27 (SUTURE) ×4
SUT VIC AB 2-0 CT1 TAPERPNT 27 (SUTURE) ×2 IMPLANT
SUT VLOC 180 0 24IN GS25 (SUTURE) ×3 IMPLANT
SYR 50ML LL SCALE MARK (SYRINGE) ×3 IMPLANT
TRAY FOLEY W/METER SILVER 14FR (SET/KITS/TRAYS/PACK) ×3 IMPLANT
TRAY FOLEY W/METER SILVER 16FR (SET/KITS/TRAYS/PACK) IMPLANT
YANKAUER SUCT BULB TIP 10FT TU (MISCELLANEOUS) ×3 IMPLANT

## 2015-02-12 NOTE — Evaluation (Signed)
Physical Therapy Evaluation Patient Details Name: Barbara Kirby MRN: [id number] DOB: 04/22/1955 Today's Date: 02/12/2015   History of Present Illness  R THR; pt with hx of L THR (15)  Clinical Impression  Pt s/p R THR presents with decreased R LE strength/ROM and post op pain limiting functional mobility.  Pt should progress well to dc home with family assist and HHPT follow up.    Follow Up Recommendations Home health PT    Equipment Recommendations  None recommended by PT    Recommendations for Other Services       Precautions / Restrictions Precautions Precautions: Fall Restrictions Weight Bearing Restrictions: No Other Position/Activity Restrictions: WBAT      Mobility  Bed Mobility Overal bed mobility: Needs Assistance Bed Mobility: Supine to Sit     Supine to sit: Min guard        Transfers Overall transfer level: Needs assistance Equipment used: Rolling walker (2 wheeled) Transfers: Sit to/from Stand Sit to Stand: Min guard         General transfer comment: cues for LE management and use of UEs to self assist  Ambulation/Gait Ambulation/Gait assistance: Min assist;Min guard Ambulation Distance (Feet): 120 Feet Assistive device: Rolling walker (2 wheeled) Gait Pattern/deviations: Step-to pattern;Step-through pattern;Decreased step length - right;Decreased step length - left;Shuffle;Trunk flexed Gait velocity: decr Gait velocity interpretation: Below normal speed for age/gender General Gait Details: cues for posture, position from RW and initial sequence  Stairs            Wheelchair Mobility    Modified Rankin (Stroke Patients Only)       Balance                                             Pertinent Vitals/Pain Pain Assessment: 0-10 Pain Score: 4  Pain Location: R hip/thigh Pain Descriptors / Indicators: Aching;Sore Pain Intervention(s): Limited activity within patient's tolerance;Monitored during  session;Premedicated before session;Ice applied    Home Living Family/patient expects to be discharged to:: Private residence Living Arrangements: Spouse/significant other Available Help at Discharge: Family Type of Home: House Home Access: Stairs to enter Entrance Stairs-Rails: None Technical brewer of Steps: 3 Home Layout: One level Home Equipment: Environmental consultant - 4 wheels      Prior Function Level of Independence: Independent               Hand Dominance        Extremity/Trunk Assessment   Upper Extremity Assessment: Overall WFL for tasks assessed           Lower Extremity Assessment: RLE deficits/detail      Cervical / Trunk Assessment: Normal  Communication   Communication: No difficulties  Cognition Arousal/Alertness: Awake/alert Behavior During Therapy: WFL for tasks assessed/performed Overall Cognitive Status: Within Functional Limits for tasks assessed                      General Comments      Exercises        Assessment/Plan    PT Assessment Patient needs continued PT services  PT Diagnosis Difficulty walking   PT Problem List Decreased strength;Decreased range of motion;Decreased activity tolerance;Decreased mobility;Decreased knowledge of use of DME;Obesity;Pain  PT Treatment Interventions DME instruction;Gait training;Stair training;Functional mobility training;Therapeutic exercise;Therapeutic activities;Patient/family education   PT Goals (Current goals can be found in the Care Plan section) Acute Rehab PT  Goals Patient Stated Goal: Resume previous lifestyle with decreased pain PT Goal Formulation: With patient Time For Goal Achievement: 02/15/15 Potential to Achieve Goals: Good    Frequency 7X/week   Barriers to discharge        Co-evaluation               End of Session Equipment Utilized During Treatment: Gait belt Activity Tolerance: Patient tolerated treatment well Patient left: in chair;with call  bell/phone within reach;with chair alarm set;with family/visitor present Nurse Communication: Mobility status         Time: 1542-1610 PT Time Calculation (min) (ACUTE ONLY): 28 min   Charges:   PT Evaluation $PT Eval Low Complexity: 1 Procedure PT Treatments $Gait Training: 8-22 mins   PT G Codes:        , 2015-03-01, 5:38 PM

## 2015-02-12 NOTE — Op Note (Signed)
OPERATIVE REPORT  PREOPERATIVE DIAGNOSIS: Osteoarthritis of the Right hip.   POSTOPERATIVE DIAGNOSIS: Osteoarthritis of the Right  hip.   PROCEDURE: Right total hip arthroplasty, anterior approach.   SURGEON: Gaynelle Arabian, MD   ASSISTANT: Arlee Muslim, PA-C  ANESTHESIA:  General  ESTIMATED BLOOD LOSS:-300 ml   DRAINS: Hemovac x1.   COMPLICATIONS: None   CONDITION: PACU - hemodynamically stable.   BRIEF CLINICAL NOTE: Barbara Kirby is a 60 y.o. female who has advanced end-  stage arthritis of her Right  hip with progressively worsening pain and  dysfunction.The patient has failed nonoperative management and presents for  total hip arthroplasty.   PROCEDURE IN DETAIL: After successful administration of spinal  anesthetic, the traction boots for the [facility] bed were placed on both  feet and the patient was placed onto the [facility] bed, boots placed into the leg  holders. The Right hip was then isolated from the perineum with plastic  drapes and prepped and draped in the usual sterile fashion. ASIS and  greater trochanter were marked and a oblique incision was made, starting  at about 1 cm lateral and 2 cm distal to the ASIS and coursing towards  the anterior cortex of the femur. The skin was cut with a 10 blade  through subcutaneous tissue to the level of the fascia overlying the  tensor fascia lata muscle. The fascia was then incised in line with the  incision at the junction of the anterior third and posterior 2/3rd. The  muscle was teased off the fascia and then the interval between the TFL  and the rectus was developed. The Hohmann retractor was then placed at  the top of the femoral neck over the capsule. The vessels overlying the  capsule were cauterized and the fat on top of the capsule was removed.  A Hohmann retractor was then placed anterior underneath the rectus  femoris to give exposure to the entire anterior capsule. A T-shaped  capsulotomy was  performed. The edges were tagged and the femoral head  was identified.       Osteophytes are removed off the superior acetabulum.  The femoral neck was then cut in situ with an oscillating saw. Traction  was then applied to the left lower extremity utilizing the [facility]  traction. The femoral head was then removed. Retractors were placed  around the acetabulum and then circumferential removal of the labrum was  performed. Osteophytes were also removed. Reaming starts at 45 mm to  medialize and  Increased in 2 mm increments to 49 mm. We reamed in  approximately 40 degrees of abduction, 20 degrees anteversion. A 50 mm  pinnacle acetabular shell was then impacted in anatomic position under  fluoroscopic guidance with excellent purchase. We did not need to place  any additional dome screws. A 32 mm neutral + 4 marathon liner was then  placed into the acetabular shell.       The femoral lift was then placed along the lateral aspect of the femur  just distal to the vastus ridge. The leg was  externally rotated and capsule  was stripped off the inferior aspect of the femoral neck down to the  level of the lesser trochanter, this was done with electrocautery. The femur was lifted after this was performed. The  leg was then placed and extended in adducted position to essentially delivering the femur. We also removed the capsule superiorly and the  piriformis from the piriformis  fossa to gain excellent exposure of the  proximal femur. Rongeur was used to remove some cancellous bone to get  into the lateral portion of the proximal femur for placement of the  initial starter reamer. The starter broaches was placed  the starter broach  and was shown to go down the center of the canal. Broaching  with the  Corail system was then performed starting at size 8, coursing  Up to size 11. A size 11 had excellent torsional and rotational  and axial stability. The trial standard offset neck was then placed  with a  32 + 1 trial head. The hip was then reduced. We confirmed that  the stem was in the canal both on AP and lateral x-rays. It also has excellent sizing. The hip was reduced with outstanding stability through full extension, full external rotation,  and then flexion in adduction internal rotation. AP pelvis was taken  and the leg lengths were measured and found to be exactly equal. Hip  was then dislocated again and the femoral head and neck removed. The  femoral broach was removed. Size 11 Corail stem with a standard offset  neck was then impacted into the femur following native anteversion. Has  excellent purchase in the canal. Excellent torsional and rotational and  axial stability. It is confirmed to be in the canal on AP and lateral  fluoroscopic views. The 32 + 1 ceramic head was placed and the hip  reduced with outstanding stability. Again AP pelvis was taken and it  confirmed that the leg lengths were equal. The wound was then copiously  irrigated with saline solution and the capsule reattached and repaired  with Ethibond suture. 30 ml of .25% Bupivicaine injected into the capsule and into the edge of the tensor fascia lata as well as subcutaneous tissue. The fascia overlying the tensor fascia lata was  then closed with a running #1 V-Loc. Subcu was closed with interrupted  2-0 Vicryl and subcuticular running 4-0 Monocryl. Incision was cleaned  and dried. Steri-Strips and a bulky sterile dressing applied. Hemovac  drain was hooked to suction and then he was awakened and transported to  recovery in stable condition.        Please note that a surgical assistant was a medical necessity for this procedure to perform it in a safe and expeditious manner. Assistant was necessary to provide appropriate retraction of vital neurovascular structures and to prevent femoral fracture and allow for anatomic placement of the prosthesis.  Gaynelle Arabian, M.D.

## 2015-02-12 NOTE — Anesthesia Procedure Notes (Addendum)
Spinal Patient location during procedure: OR Start time: 02/12/2015 8:35 AM End time: 02/12/2015 8:42 AM Staffing Anesthesiologist: Jillyn Hidden Resident/CRNA: Dion Saucier E Performed by: anesthesiologist and resident/CRNA  Preanesthetic Checklist Completed: patient identified, site marked, surgical consent, pre-op evaluation, timeout performed, IV checked, risks and benefits discussed and monitors and equipment checked Spinal Block Patient position: sitting Prep: Betadine Patient monitoring: heart rate, continuous pulse ox and blood pressure Location: L4-5 Injection technique: single-shot Needle Needle type: Whitacre  Needle gauge: 22 G Needle length: 9 cm Additional Notes Expiration date of kit checked and confirmed. Patient tolerated procedure well, without complications.    Procedure Name: LMA Insertion Date/Time: 02/12/2015 9:07 AM Performed by: Ofilia Neas Pre-anesthesia Checklist: Patient identified, Timeout performed, Emergency Drugs available, Suction available and Patient being monitored Patient Re-evaluated:Patient Re-evaluated prior to inductionOxygen Delivery Method: Circle system utilized Preoxygenation: Pre-oxygenation with 100% oxygen Intubation Type: IV induction LMA: LMA with gastric port inserted LMA Size: 4.0 Number of attempts: 1 Placement Confirmation: positive ETCO2 Tube secured with: Tape Dental Injury: Teeth and Oropharynx as per pre-operative assessment

## 2015-02-12 NOTE — H&P (View-Only) (Signed)
Barbara Kirby DOB: September 04, 1955 Married / Language: English / Race: White Female Date of Admission:  02/12/2015 CC:  Right Hip Pain History of Present Illness  The patient is a 60 year old female who comes in for a preoperative History and Physical. The patient is scheduled for a right total hip arthroplasty (anterior) to be performed by Dr. Dione Plover. Aluisio, MD at [facility] on 02/12/2015. The patient is a 60 year old female presenting for continue care with regards to her hips.. The patient comes in about a year out from left total hip arthroplasty (anterior approach). The patient states that she is doing well with regards tothe left hip at this time. The pain is under excellent control at this time. They are currently on Tylenol (prn. and tramodol) for their pain. The patient feels that they are progressing well at this time, however the right hip is now hurting. Radiographs taken in the office of the left hip showed a left prosthesis in excellent position with no periprosthetic abnormalities. On the right, she has bone-on-bone arthritis with essentially no visible joint space. She said the left hip is doing great at this time. She is really pleased with how that has done. Her right hip is that has given her most problem now. It is gotten progressively worse over the past six months. It is hurting at all times. It is starting to limit what she can and cannot do. It is getting to be as bad as the left one was before she had it replaced. She has got end stage arthritis of the right hip. She is at a stage where she wants to go ahead and get it fixed. She is not interested in cortisone injection because she has had a bad reaction to cortisone in the past. She is ready to proceed with surgical intervention of the right hip. They have been treated conservatively in the past for the above stated problem and despite conservative measures, they continue to have progressive pain and severe functional  limitations and dysfunction. They have failed non-operative management including home exercise, medications. It is felt that they would benefit from undergoing total joint replacement. Risks and benefits of the procedure have been discussed with the patient and they elect to proceed with surgery. There are no active contraindications to surgery such as ongoing infection or rapidly progressive neurological disease.   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)  Osteoarthritis of left hip (M16.12)   Status post hip replacement, left [id number]  Primary osteoarthritis of right hip (M16.11)  Gastroesophageal Reflux Disease  High blood pressure  Gastroesophageal Reflux Disease  High blood pressure  Chronic Pain  Vertigo  Impaired Vision  wears glasses Degenerative Disc Disease  Menopause  Mumps  Bronchitis  Past History Arrhythmia  rare occurence Chronic Pain  Vertigo  Impaired Vision  wears glasses Degenerative Disc Disease  Menopause  Mumps  Bronchitis  Past History Arrhythmia  rare occurrence  Allergies Codeine Phosphate *ANALGESICS - OPIOID*  Nausea. PredniSONE *CORTICOSTEROIDS*  Rapid pulse.  Family History Hypertension  Mother. Heart disease in female family member before age 32  Heart Disease  Maternal Grandfather, Mother. Osteoporosis  Mother. Osteoarthritis  Mother. Kidney disease  Mother, Paternal Jon Gills. Chronic Obstructive Lung Disease  Father, Mother. Cerebrovascular Accident  Mother. Cancer  Father, Maternal Grandmother, Paternal Grandfather. First Degree Relatives  reported Diabetes Mellitus  Mother. Congestive Heart Failure  Mother. Autoimmune disorder  Paternal Grandmother.  Social History  Current occupation  Arboriculturist  Current work status  working full time Marital status  married Living situation  live with spouse Children  1 Exercise  Exercises weekly;  does other Never consumed alcohol  06/19/2013: Never consumed alcohol No history of drug/alcohol rehab  Not under pain contract  Number of flights of stairs before winded  1 Tobacco / smoke exposure  06/19/2013: no Tobacco use  Never smoker. 06/19/2013  Medication History Aspirin EC (81MG  Tablet DR, Oral) Active. Effexor XR (150MG  Capsule ER 24HR, Oral) Active. Gabapentin (300MG  Capsule, Oral) Active. Triamterene-HCTZ (37.5-25MG  Tablet, 1/2 Oral daily) Active. Toprol XL (50MG  Tablet ER 24HR, Oral) Active. Amitriptyline HCl (25MG  Tablet, 1 (one) Tablet Oral po qhs, Taken starting 05/17/2014) Active. Vimovo (500-20MG  Tablet DR, Oral) Active. Tylenol Extra Strength (500MG  Tablet, Oral as needed) Active. TraMADol HCl (50MG  Tablet, Oral) Active.  Past Surgical History Spinal Surgery  Tonsillectomy  Dilation and Curettage of Uterus - Multiple  Spinal Fusion  lower back Total Hip Replacement - Left  Date: 2015.   Review of Systems General Not Present- Chills, Fatigue, Fever, Memory Loss, Night Sweats, Weight Gain and Weight Loss. Skin Not Present- Eczema, Hives, Itching, Lesions and Rash. HEENT Not Present- Dentures, Double Vision, Headache, Hearing Loss, Tinnitus and Visual Loss. Respiratory Not Present- Allergies, Chronic Cough, Coughing up blood, Shortness of breath at rest and Shortness of breath with exertion. Cardiovascular Not Present- Chest Pain, Difficulty Breathing Lying Down, Murmur, Palpitations, Racing/skipping heartbeats and Swelling. Gastrointestinal Not Present- Abdominal Pain, Bloody Stool, Constipation, Diarrhea, Difficulty Swallowing, Heartburn, Jaundice, Loss of appetitie, Nausea and Vomiting. Female Genitourinary Not Present- Blood in Urine, Discharge, Flank Pain, Incontinence, Painful Urination, Urgency, Urinary frequency, Urinary Retention, Urinating at Night and Weak urinary stream. Musculoskeletal Not Present- Back Pain, Joint Pain, Joint  Swelling, Morning Stiffness, Muscle Pain, Muscle Weakness and Spasms. Neurological Not Present- Blackout spells, Difficulty with balance, Dizziness, Paralysis, Tremor and Weakness. Psychiatric Not Present- Insomnia.  Vitals Weight: 228 lb Height: 68in Weight was reported by patient. Height was reported by patient. Body Surface Area: 2.16 m Body Mass Index: 34.67 kg/m  BP: 126/72 (Sitting, Right Arm, Standard)  Physical Exam  General Mental Status -Alert, cooperative and good historian. General Appearance-pleasant, Not in acute distress. Orientation-Oriented X3. Build & Nutrition-Well nourished and Well developed.  Head and Neck Head-normocephalic, atraumatic . Neck Global Assessment - supple, no bruit auscultated on the right, no bruit auscultated on the left.  Eye Vision-Wears corrective lenses. Pupil - Bilateral-Regular and Round. Motion - Bilateral-EOMI.  Chest and Lung Exam Auscultation Breath sounds - clear at anterior chest wall and clear at posterior chest wall. Adventitious sounds - No Adventitious sounds.  Cardiovascular Auscultation Rhythm - Regular rate and rhythm. Heart Sounds - S1 WNL and S2 WNL. Murmurs & Other Heart Sounds - Auscultation of the heart reveals - No Murmurs.  Abdomen Palpation/Percussion Tenderness - Abdomen is non-tender to palpation. Rigidity (guarding) - Abdomen is soft. Auscultation Auscultation of the abdomen reveals - Bowel sounds normal.  Female Genitourinary Note: Not done, not pertinent to present illness   Musculoskeletal Note: On exam, she is alert and oriented, in no apparent distress. Her left hip can flexed to about 120, rotated in 30, out 40, abducted 40 without discomfort. Right hip is flexion about 100, no internal rotation, about 10 external rotation, and 10 abduction with discomfort. She is walking with an antalgic gait pattern on the right side.  RADIOGRAPHS AP pelvis, AP and lateral of the  left and lateral of the right showed a left  prosthesis in excellent position with no periprosthetic abnormalities. On the right, she has bone-on-bone arthritis with essentially no visible joint space.  Assessment & Plan Primary osteoarthritis of right hip (M16.11) Status post hip replacement, left [id number])  Note:Surgical Plans: Right Total Hip Replacement - Anterior Approach  Disposition: Home  PCP: Dr. Gar Ponto - Patient has been seen preoperatively and felt to be stable for surgery.  IV TXA  Anesthesia Issues: None  Signed electronically by Joelene Millin, III PA-C

## 2015-02-12 NOTE — Anesthesia Preprocedure Evaluation (Signed)
Anesthesia Evaluation  Patient identified by MRN, date of birth, ID band Patient awake    Reviewed: Allergy & Precautions, H&P , NPO status , Patient's Chart, lab work & pertinent test results, reviewed documented beta blocker date and time   History of Anesthesia Complications (+) history of anesthetic complications  Airway Mallampati: II  TM Distance: >3 FB Neck ROM: full    Dental no notable dental hx. (+) Teeth Intact, Dental Advisory Given   Pulmonary neg pulmonary ROS,    Pulmonary exam normal breath sounds clear to auscultation       Cardiovascular hypertension, Pt. on medications and Pt. on home beta blockers + CAD and + Past MI  Normal cardiovascular exam Rhythm:regular Rate:Normal     Neuro/Psych Back surgery 1/13 negative psych ROS   GI/Hepatic negative GI ROS, Neg liver ROS, GERD  Medicated and Controlled,  Endo/Other  negative endocrine ROS  Renal/GU negative Renal ROS  negative genitourinary   Musculoskeletal  (+) Arthritis ,   Abdominal   Peds  Hematology negative hematology ROS (+)   Anesthesia Other Findings   Reproductive/Obstetrics negative OB ROS                             Anesthesia Physical  Anesthesia Plan  ASA: III  Anesthesia Plan: Spinal   Post-op Pain Management:    Induction: Intravenous  Airway Management Planned: Simple Face Mask  Additional Equipment:   Intra-op Plan:   Post-operative Plan:   Informed Consent: I have reviewed the patients History and Physical, chart, labs and discussed the procedure including the risks, benefits and alternatives for the proposed anesthesia with the patient or authorized representative who has indicated his/her understanding and acceptance.   Dental Advisory Given  Plan Discussed with: CRNA and Surgeon  Anesthesia Plan Comments:         Anesthesia Quick Evaluation

## 2015-02-12 NOTE — Interval H&P Note (Signed)
History and Physical Interval Note:  02/12/2015 8:19 AM  Barbara Kirby  has presented today for surgery, with the diagnosis of right hip osteoarthritis  The various methods of treatment have been discussed with the patient and family. After consideration of risks, benefits and other options for treatment, the patient has consented to  Procedure(s): RIGHT TOTAL HIP ARTHROPLASTY ANTERIOR APPROACH (Right) as a surgical intervention .  The patient's history has been reviewed, patient examined, no change in status, stable for surgery.  I have reviewed the patient's chart and labs.  Questions were answered to the patient's satisfaction.     [name]

## 2015-02-12 NOTE — Transfer of Care (Signed)
Immediate Anesthesia Transfer of Care Note  Patient: Barbara Kirby  Procedure(s) Performed: Procedure(s): RIGHT TOTAL HIP ARTHROPLASTY ANTERIOR APPROACH (Right)  Patient Location: PACU  Anesthesia Type:General  Level of Consciousness: awake, oriented, patient cooperative, lethargic and responds to stimulation  Airway & Oxygen Therapy: Patient Spontanous Breathing and Patient connected to face mask oxygen  Post-op Assessment: Report given to RN and Post -op Vital signs reviewed and stable  Post vital signs: Reviewed and stable  Last Vitals:  Filed Vitals:   02/12/15 0626  BP: 132/68  Pulse: 71  Temp: 36.7 C  Resp: 16    Complications: No apparent anesthesia complications

## 2015-02-12 NOTE — Anesthesia Postprocedure Evaluation (Signed)
Anesthesia Post Note  Patient: Barbara Kirby  Procedure(s) Performed: Procedure(s) (LRB): RIGHT TOTAL HIP ARTHROPLASTY ANTERIOR APPROACH (Right)  Patient location during evaluation: PACU Anesthesia Type: General and Spinal Level of consciousness: awake and alert Pain management: pain level controlled Vital Signs Assessment: post-procedure vital signs reviewed and stable Respiratory status: spontaneous breathing, nonlabored ventilation, respiratory function stable and patient connected to nasal cannula oxygen Cardiovascular status: blood pressure returned to baseline and stable Postop Assessment: no signs of nausea or vomiting Anesthetic complications: no    Last Vitals:  Filed Vitals:   02/12/15 0626 02/12/15 1030  BP: 132/68 166/79  Pulse: 71 78  Temp: 36.7 C 36.6 C  Resp: 16 14    Last Pain:  Filed Vitals:   02/12/15 1041  PainSc: 3     LLE Motor Response: No movement due to regional block (02/12/15 1030) LLE Sensation: Decreased (02/12/15 1030) RLE Motor Response: No movement due to regional block (02/12/15 1030) RLE Sensation: Decreased (02/12/15 1030) L Sensory Level: L3-Anterior knee, lower leg (02/12/15 1030) R Sensory Level: L3-Anterior knee, lower leg (02/12/15 1030)  Zenaida Deed

## 2015-02-13 LAB — BASIC METABOLIC PANEL
Anion gap: 10 (ref 5–15)
BUN: 11 mg/dL (ref 6–20)
CHLORIDE: 100 mmol/L — AB (ref 101–111)
CO2: 27 mmol/L (ref 22–32)
Calcium: 8.2 mg/dL — ABNORMAL LOW (ref 8.9–10.3)
Creatinine, Ser: 0.76 mg/dL (ref 0.44–1.00)
GFR calc non Af Amer: >60 mL/min (ref >60)
Glucose, Bld: 187 mg/dL — ABNORMAL HIGH (ref 65–99)
POTASSIUM: 3.9 mmol/L (ref 3.5–5.1)
SODIUM: 137 mmol/L (ref 135–145)

## 2015-02-13 LAB — CBC
HEMATOCRIT: 35.4 % — AB (ref 36.0–46.0)
HEMOGLOBIN: 12 g/dL (ref 12.0–15.0)
MCH: 31.6 pg (ref 26.0–34.0)
MCHC: 33.9 g/dL (ref 30.0–36.0)
MCV: 93.2 fL (ref 78.0–100.0)
Platelets: 212 10*3/uL (ref 150–400)
RBC: 3.8 MIL/uL — AB (ref 3.87–5.11)
RDW: 13.2 % (ref 11.5–15.5)
WBC: 16.9 10*3/uL — ABNORMAL HIGH (ref 4.0–10.5)

## 2015-02-13 MED ORDER — HYDROMORPHONE HCL 2 MG PO TABS: 2.0000 mg | ORAL_TABLET | ORAL | Status: DC | PRN

## 2015-02-13 MED ORDER — TRAMADOL HCL 50 MG PO TABS: 50.0000 mg | ORAL_TABLET | Freq: Four times a day (QID) | ORAL | Status: AC | PRN

## 2015-02-13 MED ORDER — METHOCARBAMOL 500 MG PO TABS: 500.0000 mg | ORAL_TABLET | Freq: Four times a day (QID) | ORAL | Status: DC | PRN

## 2015-02-13 MED ORDER — RIVAROXABAN 10 MG PO TABS: 10.0000 mg | ORAL_TABLET | Freq: Every day | ORAL | Status: DC

## 2015-02-13 NOTE — Progress Notes (Signed)
02/13/15  1030  Reviewed discharge instructions with patient. Patient verbalized understanding of discharge instructions. Copy of discharge instructions and prescriptions given to patient.

## 2015-02-13 NOTE — Care Management Note (Signed)
Case Management Note  Patient Details  Name: Barbara Kirby MRN: [id number] Date of Birth: 1955-04-16  Subjective/Objective:                  Right total hip arthroplasty, anterior approach.  Action/Plan: Discharge planning Expected Discharge Date:  02/13/15               Expected Discharge Plan:  [place]  In-House Referral:     Discharge planning Services  CM Consult  Post Acute Care Choice:  Home Health Choice offered to:  Patient  DME Arranged:  N/A DME Agency:  NA  HH Arranged:  PT HH Agency:  [facility]  Status of Service:  Completed, signed off  Medicare Important Message Given:    Date Medicare IM Given:    Medicare IM give by:    Date Additional Medicare IM Given:    Additional Medicare Important Message give by:     If discussed at [place] of Stay Meetings, dates discussed:    Additional Comments: Cm met with pt in room to offer choice of home health agency.  Pt chooses [facility] to render HHPT.  Pt has both a rolling waler and 3n1 from previous surgery.  Referral given to [employer], Tim.  No other CM needs were communicated. Dellie Catholic, RN 02/13/2015, 9:47 AM

## 2015-02-13 NOTE — Progress Notes (Signed)
Occupational Therapy Evaluation Patient Details Name: Barbara Kirby MRN: [id number] DOB: [birth year] Today's Date: 02/13/2015    History of Present Illness R THR; pt with hx of L THR (15)   Clinical Impression   Pt admitted with the above diagnoses and presents with below problem list. PTA pt was independent with ADLs. Pt is currently supervision to min guard with LB ADLs, functional mobility, and toilet transfers. ADL education reviewed. No further acute OT needs indicated. OT signing off.     Follow Up Recommendations  No OT follow up;Supervision - Intermittent    Equipment Recommendations  None recommended by OT    Recommendations for Other Services       Precautions / Restrictions Precautions Precautions: Fall Restrictions Other Position/Activity Restrictions: WBAT      Mobility Bed Mobility Overal bed mobility: Needs Assistance Bed Mobility: Supine to Sit     Supine to sit: Supervision        Transfers Overall transfer level: Needs assistance Equipment used: Rolling walker (2 wheeled) Transfers: Sit to/from Stand Sit to Stand: Min guard;Supervision         General transfer comment: from EOB and comfort height toilet    Balance Overall balance assessment: Needs assistance Sitting-balance support: No upper extremity supported;Feet supported Sitting balance-Leahy Scale: Good     Standing balance support: Bilateral upper extremity supported;During functional activity Standing balance-Leahy Scale: Fair Standing balance comment: stood at sink to wash hands, no LOB                            ADL Overall ADL's : Needs assistance/impaired Eating/Feeding: Set up;Sitting   Grooming: Supervision/safety;Wash/dry hands   Upper Body Bathing: Set up;Sitting   Lower Body Bathing: Min guard;Sit to/from stand   Upper Body Dressing : Set up;Sitting   Lower Body Dressing: Sit to/from stand;Min guard   Toilet Transfer:  Supervision/safety;Ambulation;Comfort height toilet;Grab bars;RW   Toileting- Clothing Manipulation and Hygiene: Set up;Sitting/lateral lean       Functional mobility during ADLs: Min guard;Rolling walker;Supervision/safety General ADL Comments: Pt completed toilet transfer, pericare,and washed hands at sink at levels detailed above. Reviewed ADL education and home setup for toilet/shower transfers.      Vision     Perception     Praxis      Pertinent Vitals/Pain Pain Assessment: Faces Faces Pain Scale: Hurts a little bit Pain Location: R hip/thigh Pain Descriptors / Indicators: Sore;Tightness Pain Intervention(s): Monitored during session;Premedicated before session;Repositioned     Hand Dominance     Extremity/Trunk Assessment Upper Extremity Assessment Upper Extremity Assessment: Overall WFL for tasks assessed   Lower Extremity Assessment Lower Extremity Assessment: Defer to PT evaluation   Cervical / Trunk Assessment Cervical / Trunk Assessment: Normal   Communication Communication Communication: No difficulties   Cognition Arousal/Alertness: Awake/alert Behavior During Therapy: WFL for tasks assessed/performed Overall Cognitive Status: Within Functional Limits for tasks assessed                     General Comments       Exercises       Shoulder Instructions      Home Living Family/patient expects to be discharged to:: Private residence Living Arrangements: Spouse/significant other Available Help at Discharge: Family Type of Home: House Home Access: Stairs to enter Technical brewer of Steps: 3 Entrance Stairs-Rails: None Home Layout: One level     Bathroom Shower/Tub: Tub/shower unit;Curtain Shower/tub characteristics: Architectural technologist: Standard  Home Equipment: [place] - 4 wheels;Tub bench;Grab bars - tub/shower          Prior Functioning/Environment Level of Independence: Independent             OT  Diagnosis: Acute pain   OT Problem List:     OT Treatment/Interventions:      OT Goals(Current goals can be found in the care plan section) Acute Rehab OT Goals Patient Stated Goal: Resume previous lifestyle with decreased pain  OT Frequency:     Barriers to D/C:            Co-evaluation              End of Session Equipment Utilized During Treatment: Rolling walker  Activity Tolerance: Patient tolerated treatment well Patient left: in bed;with call bell/phone within reach;with family/visitor present   Time: [id number] OT Time Calculation (min): 11 min Charges:  OT General Charges $OT Visit: 1 Procedure OT Evaluation $OT Eval Low Complexity: 1 Procedure G-Codes:    Hortencia Pilar 03-14-2015, 9:37 AM

## 2015-02-13 NOTE — Discharge Summary (Signed)
Physician Discharge Summary   Patient ID: Barbara Kirby MRN: [id number] DOB/AGE: 28-May-1955 60 y.o.  Admit date: 02/12/2015 Discharge date: 02-13-2015  Primary Diagnosis:  Osteoarthritis of the Right hip.   Admission Diagnoses:  Past Medical History  Diagnosis Date  . Hypertension   . Tachycardia     occasional  . Fluttering heart (HCC)     occasional  . GERD (gastroesophageal reflux disease)   . Arthritis   . Endometriosis   . Fibroids     uterine  . Anemia 2010    h/o "heavy period for months"  . H/O bronchitis   . Laryngeal spasm 2010    hx of from bronchitis, no problems since  . Positive H. pylori test   . Complication of anesthesia     "epidural with child birth was too high-hard time breathing"  . Family history of adverse reaction to anesthesia     mom was hard to wake up   Discharge Diagnoses:   Active Problems:   OA (osteoarthritis) of hip  Estimated body mass index is 35.88 kg/(m^2) as calculated from the following:   Height as of this encounter: 5' 8"  (1.727 m).   Weight as of this encounter: 107 kg (235 lb 14.3 oz).  Procedure(s) (LRB): RIGHT TOTAL HIP ARTHROPLASTY ANTERIOR APPROACH (Right)   Consults: None  HPI: Barbara Kirby is a 60 y.o. female who has advanced end-  stage arthritis of her Right hip with progressively worsening pain and  dysfunction.The patient has failed nonoperative management and presents for  total hip arthroplasty.   Laboratory Data: Admission on 02/12/2015  Component Date Value Ref Range Status  . WBC 02/13/2015 16.9* 4.0 - 10.5 K/uL Final  . RBC 02/13/2015 3.80* 3.87 - 5.11 MIL/uL Final  . Hemoglobin 02/13/2015 12.0  12.0 - 15.0 g/dL Final  . HCT 02/13/2015 35.4* 36.0 - 46.0 % Final  . MCV 02/13/2015 93.2  78.0 - 100.0 fL Final  . MCH 02/13/2015 31.6  26.0 - 34.0 pg Final  . MCHC 02/13/2015 33.9  30.0 - 36.0 g/dL Final  . RDW 02/13/2015 13.2  11.5 - 15.5 % Final  . Platelets 02/13/2015 212  150 - 400 K/uL  Final  . Sodium 02/13/2015 137  135 - 145 mmol/L Final  . Potassium 02/13/2015 3.9  3.5 - 5.1 mmol/L Final  . Chloride 02/13/2015 100* 101 - 111 mmol/L Final  . CO2 02/13/2015 27  22 - 32 mmol/L Final  . Glucose, Bld 02/13/2015 187* 65 - 99 mg/dL Final  . BUN 02/13/2015 11  6 - 20 mg/dL Final  . Creatinine, Ser 02/13/2015 0.76  0.44 - 1.00 mg/dL Final  . Calcium 02/13/2015 8.2* 8.9 - 10.3 mg/dL Final  . GFR calc non Af Amer 02/13/2015 >60  >60 mL/min Final  . GFR calc Af Amer 02/13/2015 >60  >60 mL/min Final   Comment: (NOTE) The eGFR has been calculated using the CKD EPI equation. This calculation has not been validated in all clinical situations. eGFR's persistently <60 mL/min signify possible Chronic Kidney Disease.   Georgiann Hahn gap 02/13/2015 10  5 - 15 Final  Hospital Outpatient Visit on 02/07/2015  Component Date Value Ref Range Status  . aPTT 02/07/2015 30  24 - 37 seconds Final  . WBC 02/07/2015 7.6  4.0 - 10.5 K/uL Final  . RBC 02/07/2015 4.61  3.87 - 5.11 MIL/uL Final  . Hemoglobin 02/07/2015 14.4  12.0 - 15.0 g/dL Final  . HCT 02/07/2015 43.0  36.0 - 46.0 % Final  . MCV 02/07/2015 93.3  78.0 - 100.0 fL Final  . MCH 02/07/2015 31.2  26.0 - 34.0 pg Final  . MCHC 02/07/2015 33.5  30.0 - 36.0 g/dL Final  . RDW 02/07/2015 13.2  11.5 - 15.5 % Final  . Platelets 02/07/2015 259  150 - 400 K/uL Final  . Sodium 02/07/2015 137  135 - 145 mmol/L Final  . Potassium 02/07/2015 3.9  3.5 - 5.1 mmol/L Final  . Chloride 02/07/2015 101  101 - 111 mmol/L Final  . CO2 02/07/2015 25  22 - 32 mmol/L Final  . Glucose, Bld 02/07/2015 94  65 - 99 mg/dL Final  . BUN 02/07/2015 16  6 - 20 mg/dL Final  . Creatinine, Ser 02/07/2015 0.74  0.44 - 1.00 mg/dL Final  . Calcium 02/07/2015 8.9  8.9 - 10.3 mg/dL Final  . Total Protein 02/07/2015 7.4  6.5 - 8.1 g/dL Final  . Albumin 02/07/2015 4.1  3.5 - 5.0 g/dL Final  . AST 02/07/2015 22  15 - 41 U/L Final  . ALT 02/07/2015 23  14 - 54 U/L Final  .  Alkaline Phosphatase 02/07/2015 50  38 - 126 U/L Final  . Total Bilirubin 02/07/2015 1.2  0.3 - 1.2 mg/dL Final  . GFR calc non Af Amer 02/07/2015 >60  >60 mL/min Final  . GFR calc Af Amer 02/07/2015 >60  >60 mL/min Final   Comment: (NOTE) The eGFR has been calculated using the CKD EPI equation. This calculation has not been validated in all clinical situations. eGFR's persistently <60 mL/min signify possible Chronic Kidney Disease.   . Anion gap 02/07/2015 11  5 - 15 Final  . Prothrombin Time 02/07/2015 13.7  11.6 - 15.2 seconds Final  . INR 02/07/2015 1.03  0.00 - 1.49 Final  . ABO/RH(D) 02/07/2015 A POS   Final  . Antibody Screen 02/07/2015 NEG   Final  . Sample Expiration 02/07/2015 02/15/2015   Final  . Extend sample reason 02/07/2015 NO TRANSFUSIONS OR PREGNANCY IN THE PAST 3 MONTHS   Final  . Color, Urine 02/07/2015 YELLOW  YELLOW Final  . APPearance 02/07/2015 CLOUDY* CLEAR Final  . Specific Gravity, Urine 02/07/2015 1.020  1.005 - 1.030 Final  . pH 02/07/2015 5.5  5.0 - 8.0 Final  . Glucose, UA 02/07/2015 NEGATIVE  NEGATIVE mg/dL Final  . Hgb urine dipstick 02/07/2015 NEGATIVE  NEGATIVE Final  . Bilirubin Urine 02/07/2015 NEGATIVE  NEGATIVE Final  . Ketones, ur 02/07/2015 NEGATIVE  NEGATIVE mg/dL Final  . Protein, ur 02/07/2015 NEGATIVE  NEGATIVE mg/dL Final  . Nitrite 02/07/2015 POSITIVE* NEGATIVE Final  . Leukocytes, UA 02/07/2015 NEGATIVE  NEGATIVE Final  . MRSA, PCR 02/07/2015 NEGATIVE  NEGATIVE Final  . Staphylococcus aureus 02/07/2015 NEGATIVE  NEGATIVE Final   Comment:        The Xpert SA Assay (FDA approved for NASAL specimens in patients over 24 years of age), is one component of a comprehensive surveillance program.  Test performance has been validated by [facility] for patients greater than or equal to 58 year old. It is not intended to diagnose infection nor to guide or monitor treatment.   . Squamous Epithelial / LPF 02/07/2015 0-5* NONE SEEN Final    . WBC, UA 02/07/2015 0-5  0 - 5 WBC/hpf Final  . RBC / HPF 02/07/2015 0-5  0 - 5 RBC/hpf Final  . Bacteria, UA 02/07/2015 MANY* NONE SEEN Final     X-Rays:Dg Pelvis Portable  02/12/2015  CLINICAL DATA:  Status post right total hip arthroplasty EXAM: PORTABLE PELVIS 1-2 VIEWS COMPARISON:  10/24/2013 pelvic radiograph FINDINGS: The patient is status post interval right total hip arthroplasty, with well-positioned right proximal femoral and right acetabular prostheses. Previous left total hip arthroplasty appears well positioned with no evidence of hardware fracture or loosening. No malalignment is seen at the hip joints on this single frontal view. Surgical drain overlies the right hip. No appreciable osseous fracture or suspicious focal osseous lesion. Expected soft tissue gas surrounding the right hip. IMPRESSION: Satisfactory appearance status post interval right total hip arthroplasty. Stable previous left total hip arthroplasty . Electronically Signed   By: Ilona Sorrel M.D.   On: 02/12/2015 10:48   Dg C-arm 1-60 Min-no Report  02/12/2015  CLINICAL DATA: surgery C-ARM 1-60 MINUTES Fluoroscopy was utilized by the requesting physician.  No radiographic interpretation.    EKG: Orders placed or performed during the hospital encounter of 02/07/15  . EKG 12 lead  . EKG 12 lead     Hospital Course: Patient was admitted to [facility] and taken to the OR and underwent the above state procedure without complications.  Patient tolerated the procedure well and was later transferred to the recovery room and then to the orthopaedic floor for postoperative care.  They were given PO and IV analgesics for pain control following their surgery.  They were given 24 hours of postoperative antibiotics of  Anti-infectives    Start     Dose/Rate Route Frequency Ordered Stop   02/12/15 1500  ceFAZolin (ANCEF) IVPB 2 g/50 mL premix     2 g 100 mL/hr over 30 Minutes Intravenous Every 6 hours 02/12/15 1404  02/12/15 2209   02/12/15 0645  ceFAZolin (ANCEF) IVPB 2 g/50 mL premix     2 g 100 mL/hr over 30 Minutes Intravenous On call to O.R. 02/12/15 0630 02/12/15 0815     and started on DVT prophylaxis in the form of Xarelto.   PT and OT were ordered for total hip protocol.  The patient was allowed to be WBAT with therapy. Discharge planning was consulted to help with postop disposition and equipment needs.  Patient had a good night on the evening of surgery. She got up and walked 120 feet the day of surgery.  They got up OOB with therapy on day one again with therapy.  Hemovac drain was pulled without difficulty.  Dressing was checked and looked okay. Patient was seen in rounds by Dr. Wynelle Link and was ready to go home following therapy  Discharge home with home health Diet - Cardiac diet Follow up - in 2 weeks Activity - WBAT Disposition - Home Condition Upon Discharge - Good D/C Meds - See DC Summary DVT Prophylaxis - Xarelto  Discharge Instructions    Call MD / Call 911    Complete by:  As directed   If you experience chest pain or shortness of breath, CALL 911 and be transported to the hospital emergency room.  If you develope a fever above 101 F, pus (white drainage) or increased drainage or redness at the wound, or calf pain, call your surgeon's office.     Change dressing    Complete by:  As directed   You may change your dressing dressing daily with sterile 4 x 4 inch gauze dressing and paper tape.  Do not submerge the incision under water.     Constipation Prevention    Complete by:  As directed  Drink plenty of fluids.  Prune juice may be helpful.  You may use a stool softener, such as Colace (over the counter) 100 mg twice a day.  Use MiraLax (over the counter) for constipation as needed.     Diet - low sodium heart healthy    Complete by:  As directed      Discharge instructions    Complete by:  As directed   Pick up stool softner and laxative for home use following surgery while  on pain medications. Do not submerge incision under water. May remove the surgical dressing tomorrow, Friday 02/14/2015, and then apply a dry gauze dressing daily. Please use good hand washing techniques while changing dressing each day. May shower starting three days after surgery starting Saturday 02/15/2015. Please use a clean towel to pat the incision dry following showers. Continue to use ice for pain and swelling after surgery. Do not use any lotions or creams on the incision until instructed by your surgeon.  Postoperative Constipation Protocol  Constipation - defined medically as fewer than three stools per week and severe constipation as less than one stool per week.  One of the most common issues patients have following surgery is constipation. Even if you have a regular bowel pattern at home, your normal regimen is likely to be disrupted due to multiple reasons following surgery. Combination of anesthesia, postoperative narcotics, change in appetite and fluid intake all can affect your bowels. In order to avoid complications following surgery, here are some recommendations in order to help you during your recovery period.  Colace (docusate) - Pick up an over-the-counter form of Colace or another stool softener and take twice a day as long as you are requiring postoperative pain medications. Take with a full glass of water daily. If you experience loose stools or diarrhea, hold the colace until you stool forms back up. If your symptoms do not get better within 1 week or if they get worse, check with your doctor.  Dulcolax (bisacodyl) - Pick up over-the-counter and take as directed by the product packaging as needed to assist with the movement of your bowels. Take with a full glass of water. Use this product as needed if not relieved by Colace only.   MiraLax (polyethylene glycol) - Pick up over-the-counter to have on hand. MiraLax is a solution that will increase the amount of  water in your bowels to assist with bowel movements. Take as directed and can mix with a glass of water, juice, soda, coffee, or tea. Take if you go more than two days without a movement. Do not use MiraLax more than once per day. Call your doctor if you are still constipated or irregular after using this medication for 7 days in a row.  If you continue to have problems with postoperative constipation, please contact the office for further assistance and recommendations. If you experience "the worst abdominal pain ever" or develop nausea or vomiting, please contact the office immediatly for further recommendations for treatment.  Take Xarelto for two and a half more weeks, then discontinue Xarelto. Once the patient has completed the Xarelto, they may resume the 81 mg Aspirin.      Do not sit on low chairs, stoools or toilet seats, as it may be difficult to get up from low surfaces    Complete by:  As directed      Driving restrictions    Complete by:  As directed   No driving until released by the physician.  Increase activity slowly as tolerated    Complete by:  As directed      Lifting restrictions    Complete by:  As directed   No lifting until released by the physician.     Patient may shower    Complete by:  As directed   You may shower without a dressing once there is no drainage.  Do not wash over the wound.  If drainage remains, do not shower until drainage stops.     TED hose    Complete by:  As directed   Use stockings (TED hose) for 3 weeks on both leg(s).  You may remove them at night for sleeping.     Weight bearing as tolerated    Complete by:  As directed   Laterality:  right  Extremity:  Lower            Medication List    STOP taking these medications        aspirin EC 81 MG tablet     VIMOVO 500-20 MG Tbec  Generic drug:  Naproxen-Esomeprazole      TAKE these medications        acetaminophen 325 MG tablet  Commonly known as:  TYLENOL  Take 650 mg  by mouth once as needed for moderate pain.     gabapentin 300 MG capsule  Commonly known as:  NEURONTIN  Take 300 mg by mouth at bedtime.     HYDROmorphone 2 MG tablet  Commonly known as:  DILAUDID  Take 1-2 tablets (2-4 mg total) by mouth every 4 (four) hours as needed for moderate pain or severe pain.     methocarbamol 500 MG tablet  Commonly known as:  ROBAXIN  Take 1 tablet (500 mg total) by mouth every 6 (six) hours as needed for muscle spasms.     metoprolol succinate 50 MG 24 hr tablet  Commonly known as:  TOPROL-XL  Take 50 mg by mouth at bedtime. Take with or immediately following a meal.     nitrofurantoin (macrocrystal-monohydrate) 100 MG capsule  Commonly known as:  MACROBID  Take 100 mg by mouth 2 (two) times daily.     rivaroxaban 10 MG Tabs tablet  Commonly known as:  XARELTO  Take 1 tablet (10 mg total) by mouth daily with breakfast. Take Xarelto for two and a half more weeks, then discontinue Xarelto. Once the patient has completed the Xarelto, they may resume the 81 mg Aspirin.     traMADol 50 MG tablet  Commonly known as:  ULTRAM  Take 1-2 tablets (50-100 mg total) by mouth every 6 (six) hours as needed (mild pain).     triamterene-hydrochlorothiazide 37.5-25 MG tablet  Commonly known as:  MAXZIDE-25  Take 0.5 tablets by mouth at bedtime.     venlafaxine XR 150 MG 24 hr capsule  Commonly known as:  EFFEXOR-XR  Take 150 mg by mouth at bedtime.           Follow-up Information    Follow up with [name], MD. Schedule an appointment as soon as possible for a visit on 02/25/2015.   Specialty:  Orthopedic Surgery   Why:  Call office at [phone] to setup appointment on Tuesday 1/31/207 with Dr. Wynelle Link.   Contact information:   [address] [place] [address] [phone]       Signed: Arlee Muslim, PA-C Orthopaedic Surgery 02/13/2015, 7:37 AM

## 2015-02-13 NOTE — Progress Notes (Signed)
Foley discontinued 06:40 am, pt due to void by 12:30 noon today. Pt know to notify staff of first voiding since foley removal.

## 2015-02-13 NOTE — Progress Notes (Signed)
   Subjective: 1 Day Post-Op Procedure(s) (LRB): RIGHT TOTAL HIP ARTHROPLASTY ANTERIOR APPROACH (Right) Patient reports pain as mild.   Patient seen in rounds by Dr. Wynelle Link.  She is doing very well this morning. She walked 120 feet yesterday. Patient is well, and has had no acute complaints or problems We will resume therapy today.  If they do well with therapy and meets all goals, then will allow home later this afternoon following therapy. Plan is to go Home after hospital stay.  Objective: Vital signs in last 24 hours: Temp:  [97.8 F (36.6 C)-98.6 F (37 C)] 98.5 F (36.9 C) (01/19 0441) Pulse Rate:  [65-80] 68 (01/19 0441) Resp:  [12-22] 16 (01/19 0441) BP: (120-180)/(41-85) 124/44 mmHg (01/19 0441) SpO2:  [97 %-100 %] 100 % (01/19 0441) Weight:  [107 kg (235 lb 14.3 oz)] 107 kg (235 lb 14.3 oz) (01/18 1352)  Intake/Output from previous day:  Intake/Output Summary (Last 24 hours) at 02/13/15 0723 Last data filed at 02/13/15 0701  Gross per 24 hour  Intake 3487.5 ml  Output   4245 ml  Net -757.5 ml    Intake/Output this shift: Total I/O In: -  Out: 300 [Urine:300]  Labs:  Recent Labs  02/13/15 0442  HGB 12.0    Recent Labs  02/13/15 0442  WBC 16.9*  RBC 3.80*  HCT 35.4*  PLT 212    Recent Labs  02/13/15 0442  NA 137  K 3.9  CL 100*  CO2 27  BUN 11  CREATININE 0.76  GLUCOSE 187*  CALCIUM 8.2*   No results for input(s): LABPT, INR in the last 72 hours.  EXAM General - Patient is Alert, Appropriate and Oriented Extremity - Neurovascular intact Sensation intact distally Dorsiflexion/Plantar flexion intact Dressing - dressing C/D/I Motor Function - intact, moving foot and toes well on exam.  Hemovac pulled without difficulty.  Past Medical History  Diagnosis Date  . Hypertension   . Tachycardia     occasional  . Fluttering heart (HCC)     occasional  . GERD (gastroesophageal reflux disease)   . Arthritis   . Endometriosis   .  Fibroids     uterine  . Anemia 2010    h/o "heavy period for months"  . H/O bronchitis   . Laryngeal spasm 2010    hx of from bronchitis, no problems since  . Positive H. pylori test   . Complication of anesthesia     "epidural with child birth was too high-hard time breathing"  . Family history of adverse reaction to anesthesia     mom was hard to wake up    Assessment/Plan: 1 Day Post-Op Procedure(s) (LRB): RIGHT TOTAL HIP ARTHROPLASTY ANTERIOR APPROACH (Right) Active Problems:   OA (osteoarthritis) of hip  Estimated body mass index is 35.88 kg/(m^2) as calculated from the following:   Height as of this encounter: 5\' 8"  (1.727 m).   Weight as of this encounter: 107 kg (235 lb 14.3 oz). Up with therapy Discharge home with home health  DVT Prophylaxis - Xarelto Weight Bearing As Tolerated right Leg Hemovac Pulled Begin Therapy  If meets goals and able to go home: Discharge home with home health Diet - Cardiac diet Follow up - in 2 weeks Activity - WBAT Disposition - Home Condition Upon Discharge - Good D/C Meds - See DC Summary DVT Prophylaxis - Xarelto  Arlee Muslim, PA-C Orthopaedic Surgery 02/13/2015, 7:23 AM

## 2015-02-13 NOTE — Progress Notes (Signed)
Physical Therapy Treatment Patient Details Name: Barbara Kirby MRN: [id number] DOB: 10-10-1955 Today's Date: 02/13/2015    History of Present Illness R THR; pt with hx of L THR (15)    PT Comments    POD # 1 Pt progressing well.  amb in hallway and practiced stairs.  Ready for D/C to home.   Follow Up Recommendations  Home health PT     Equipment Recommendations  None recommended by PT    Recommendations for Other Services       Precautions / Restrictions Precautions Precautions: Fall Restrictions Weight Bearing Restrictions: No Other Position/Activity Restrictions: WBAT    Mobility  Bed Mobility               General bed mobility comments: Pt OOB in recliner  Transfers Overall transfer level: Modified independent Equipment used: Rolling walker (2 wheeled) Transfers: Sit to/from Stand Sit to Stand: Supervision         General transfer comment: good safety gognition  Ambulation/Gait Ambulation/Gait assistance: Supervision Ambulation Distance (Feet): 125 Feet Assistive device: Rolling walker (2 wheeled) Gait Pattern/deviations: Step-through pattern;Trunk flexed     General Gait Details: cues for posture, position from RW and initial sequence   Stairs Stairs: Yes   Stair Management: No rails;Step to pattern;Forwards Number of Stairs: 3 General stair comments: one initial VC on proper tech  Wheelchair Mobility    Modified Rankin (Stroke Patients Only)       Balance                                    Cognition Arousal/Alertness: Awake/alert Behavior During Therapy: WFL for tasks assessed/performed Overall Cognitive Status: Within Functional Limits for tasks assessed                      Exercises      General Comments        Pertinent Vitals/Pain Pain Assessment: 0-10 Pain Score: 3  Pain Location: R hip Pain Descriptors / Indicators: Sore;Tender Pain Intervention(s): Monitored during  session;Premedicated before session;Repositioned;Ice applied    Home Living                      Prior Function            PT Goals (current goals can now be found in the care plan section) Progress towards PT goals: Progressing toward goals    Frequency  7X/week    PT Plan      Co-evaluation             End of Session Equipment Utilized During Treatment: Gait belt Activity Tolerance: Patient tolerated treatment well Patient left: in chair;with call bell/phone within reach;with chair alarm set;with family/visitor present     Time: 1030-1055 PT Time Calculation (min) (ACUTE ONLY): 25 min  Charges:  $Gait Training: 8-22 mins $Therapeutic Activity: 8-22 mins                    G Codes:      Rica Koyanagi  PTA [facility]  Acute  Rehab Pager      [phone]

## 2015-02-13 NOTE — Discharge Instructions (Addendum)
° °Dr. Frank Aluisio °Total Joint Specialist °[facility] °[address] °[place], [place] [address] °[phone] ° °ANTERIOR APPROACH TOTAL HIP REPLACEMENT POSTOPERATIVE DIRECTIONS ° ° °Hip Rehabilitation, Guidelines Following Surgery  °The results of a hip operation are greatly improved after range of motion and muscle strengthening exercises. Follow all safety measures which are given to protect your hip. If any of these exercises cause increased pain or swelling in your joint, decrease the amount until you are comfortable again. Then slowly increase the exercises. Call your caregiver if you have problems or questions.  ° °HOME CARE INSTRUCTIONS  °Remove items at home which could result in a fall. This includes throw rugs or furniture in walking pathways.  °· ICE to the affected hip every three hours for 30 minutes at a time and then as needed for pain and swelling.  Continue to use ice on the hip for pain and swelling from surgery. You may notice swelling that will progress down to the foot and ankle.  This is normal after surgery.  Elevate the leg when you are not up walking on it.   °· Continue to use the breathing machine which will help keep your temperature down.  It is common for your temperature to cycle up and down following surgery, especially at night when you are not up moving around and exerting yourself.  The breathing machine keeps your lungs expanded and your temperature down. ° ° °DIET °You may resume your previous home diet once your are discharged from the hospital. ° °DRESSING / WOUND CARE / SHOWERING °You may shower 3 days after surgery, but keep the wounds dry during showering.  You may use an occlusive plastic wrap (Press'n Seal for example), NO SOAKING/SUBMERGING IN THE BATHTUB.  If the bandage gets wet, change with a clean dry gauze.  If the incision gets wet, pat the wound dry with a clean towel. °You may start showering once you are discharged home but do not  submerge the incision under water. Just pat the incision dry and apply a dry gauze dressing on daily. °Change the surgical dressing daily and reapply a dry dressing each time. ° °ACTIVITY °Walk with your walker as instructed. °Use walker as long as suggested by your caregivers. °Avoid periods of inactivity such as sitting longer than an hour when not asleep. This helps prevent blood clots.  °You may resume a sexual relationship in one month or when given the OK by your doctor.  °You may return to work once you are cleared by your doctor.  °Do not drive a car for 6 weeks or until released by you surgeon.  °Do not drive while taking narcotics. ° °WEIGHT BEARING °Weight bearing as tolerated with assist device (walker, cane, etc) as directed, use it as long as suggested by your surgeon or therapist, typically at least 4-6 weeks. ° °POSTOPERATIVE CONSTIPATION PROTOCOL °Constipation - defined medically as fewer than three stools per week and severe constipation as less than one stool per week. ° °One of the most common issues patients have following surgery is constipation.  Even if you have a regular bowel pattern at home, your normal regimen is likely to be disrupted due to multiple reasons following surgery.  Combination of anesthesia, postoperative narcotics, change in appetite and fluid intake all can affect your bowels.  In order to avoid complications following surgery, here are some recommendations in order to help you during your recovery period. ° °Colace (docusate) - Pick up an over-the-counter   form of Colace or another stool softener and take twice a day as long as you are requiring postoperative pain medications.  Take with a full glass of water daily.  If you experience loose stools or diarrhea, hold the colace until you stool forms back up.  If your symptoms do not get better within 1 week or if they get worse, check with your doctor. ° °Dulcolax (bisacodyl) - Pick up over-the-counter and take as directed  by the product packaging as needed to assist with the movement of your bowels.  Take with a full glass of water.  Use this product as needed if not relieved by Colace only.  ° °MiraLax (polyethylene glycol) - Pick up over-the-counter to have on hand.  MiraLax is a solution that will increase the amount of water in your bowels to assist with bowel movements.  Take as directed and can mix with a glass of water, juice, soda, coffee, or tea.  Take if you go more than two days without a movement. °Do not use MiraLax more than once per day. Call your doctor if you are still constipated or irregular after using this medication for 7 days in a row. ° °If you continue to have problems with postoperative constipation, please contact the office for further assistance and recommendations.  If you experience "the worst abdominal pain ever" or develop nausea or vomiting, please contact the office immediatly for further recommendations for treatment. ° °ITCHING ° If you experience itching with your medications, try taking only a single pain pill, or even half a pain pill at a time.  You can also use Benadryl over the counter for itching or also to help with sleep.  ° °TED HOSE STOCKINGS °Wear the elastic stockings on both legs for three weeks following surgery during the day but you may remove then at night for sleeping. ° °MEDICATIONS °See your medication summary on the “After Visit Summary” that the nursing staff will review with you prior to discharge.  You may have some home medications which will be placed on hold until you complete the course of blood thinner medication.  It is important for you to complete the blood thinner medication as prescribed by your surgeon.  Continue your approved medications as instructed at time of discharge. ° °PRECAUTIONS °If you experience chest pain or shortness of breath - call 911 immediately for transfer to the hospital emergency department.  °If you develop a fever greater that 101 F,  purulent drainage from wound, increased redness or drainage from wound, foul odor from the wound/dressing, or calf pain - CONTACT YOUR SURGEON.   °                                                °FOLLOW-UP APPOINTMENTS °Make sure you keep all of your appointments after your operation with your surgeon and caregivers. You should call the office at the above phone number and make an appointment for approximately two weeks after the date of your surgery or on the date instructed by your surgeon outlined in the "After Visit Summary". ° °RANGE OF MOTION AND STRENGTHENING EXERCISES  °These exercises are designed to help you keep full movement of your hip joint. Follow your caregiver's or physical therapist's instructions. Perform all exercises about fifteen times, three times per day or as directed. Exercise both hips, even if you   have had only one joint replacement. These exercises can be done on a training (exercise) mat, on the floor, on a table or on a bed. Use whatever works the best and is most comfortable for you. Use music or television while you are exercising so that the exercises are a pleasant break in your day. This will make your life better with the exercises acting as a break in routine you can look forward to.  Lying on your back, slowly slide your foot toward your buttocks, raising your knee up off the floor. Then slowly slide your foot back down until your leg is straight again.  Lying on your back spread your legs as far apart as you can without causing discomfort.  Lying on your side, raise your upper leg and foot straight up from the floor as far as is comfortable. Slowly lower the leg and repeat.  Lying on your back, tighten up the muscle in the front of your thigh (quadriceps muscles). You can do this by keeping your leg straight and trying to raise your heel off the floor. This helps strengthen the largest muscle supporting your knee.  Lying on your back, tighten up the muscles of your  buttocks both with the legs straight and with the knee bent at a comfortable angle while keeping your heel on the floor.   IF YOU ARE TRANSFERRED TO A SKILLED REHAB FACILITY If the patient is transferred to a skilled rehab facility following release from the hospital, a list of the current medications will be sent to the facility for the patient to continue.  When discharged from the skilled rehab facility, please have the facility set up the patient's [place] prior to being released. Also, the skilled facility will be responsible for providing the patient with their medications at time of release from the facility to include their pain medication, the muscle relaxants, and their blood thinner medication. If the patient is still at the rehab facility at time of the two week follow up appointment, the skilled rehab facility will also need to assist the patient in arranging follow up appointment in our office and any transportation needs.  MAKE SURE YOU:  Understand these instructions.  Get help right away if you are not doing well or get worse.    Pick up stool softner and laxative for home use following surgery while on pain medications. Do not submerge incision under water. Please use good hand washing techniques while changing dressing each day. May shower starting three days after surgery. Please use a clean towel to pat the incision dry following showers. Continue to use ice for pain and swelling after surgery. Do not use any lotions or creams on the incision until instructed by your surgeon.  Take Xarelto for two and a half more weeks, then discontinue Xarelto. Once the patient has completed the Xarelto, they may resume the 81 mg Aspirin. Information on my medicine - XARELTO (Rivaroxaban)  This medication education was reviewed with me or my healthcare representative as part of my discharge preparation.  The pharmacist that spoke with me during my hospital stay was:   Angela Adam [facility]  Why was Xarelto prescribed for you? Xarelto was prescribed for you to reduce the risk of blood clots forming after orthopedic surgery. The medical term for these abnormal blood clots is venous thromboembolism (VTE).  What do you need to know about xarelto ? Take your Xarelto ONCE DAILY at the same time every day. You  may take it either with or without food. ° °If you have difficulty swallowing the tablet whole, you may crush it and mix in applesauce just prior to taking your dose. ° °Take Xarelto® exactly as prescribed by your doctor and DO NOT stop taking Xarelto® without talking to the doctor who prescribed the medication.  Stopping without other VTE prevention medication to take the place of Xarelto® may increase your risk of developing a clot. ° °After discharge, you should have regular check-up appointments with your healthcare provider that is prescribing your Xarelto®.   ° °What do you do if you miss a dose? °If you miss a dose, take it as soon as you remember on the same day then continue your regularly scheduled once daily regimen the next day. Do not take two doses of Xarelto® on the same day.  ° °Important Safety Information °A possible side effect of Xarelto® is bleeding. You should call your healthcare provider right away if you experience any of the following: °? Bleeding from an injury or your nose that does not stop. °? Unusual colored urine (red or dark brown) or unusual colored stools (red or black). °? Unusual bruising for unknown reasons. °? A serious fall or if you hit your head (even if there is no bleeding). ° °Some medicines may interact with Xarelto® and might increase your risk of bleeding while on Xarelto®. To help avoid this, consult your healthcare provider or pharmacist prior to using any new prescription or non-prescription medications, including herbals, vitamins, non-steroidal anti-inflammatory drugs (NSAIDs) and supplements. ° °This website has  more information on Xarelto®: www.xarelto.com. ° ° °

## 2015-10-15 ENCOUNTER — Other Ambulatory Visit: Payer: Self-pay | Admitting: Family Medicine

## 2015-10-15 DIAGNOSIS — Z1231 Encounter for screening mammogram for malignant neoplasm of breast: Secondary | ICD-10-CM

## 2015-10-30 ENCOUNTER — Ambulatory Visit: Payer: PRIVATE HEALTH INSURANCE

## 2015-11-06 ENCOUNTER — Ambulatory Visit
Admission: RE | Admit: 2015-11-06 | Discharge: 2015-11-06 | Disposition: A | Discharge status: Home / Self Care | Payer: PRIVATE HEALTH INSURANCE | Source: Ambulatory Visit | Attending: Family Medicine | Admitting: Family Medicine

## 2015-11-06 DIAGNOSIS — Z1231 Encounter for screening mammogram for malignant neoplasm of breast: Secondary | ICD-10-CM

## 2016-11-01 ENCOUNTER — Other Ambulatory Visit: Payer: Self-pay | Admitting: Family Medicine

## 2016-11-01 DIAGNOSIS — Z1231 Encounter for screening mammogram for malignant neoplasm of breast: Secondary | ICD-10-CM

## 2016-11-12 ENCOUNTER — Ambulatory Visit
Admission: RE | Admit: 2016-11-12 | Discharge: 2016-11-12 | Disposition: A | Discharge status: Home / Self Care | Payer: PRIVATE HEALTH INSURANCE | Source: Ambulatory Visit | Attending: Family Medicine | Admitting: Family Medicine

## 2016-11-12 DIAGNOSIS — Z1231 Encounter for screening mammogram for malignant neoplasm of breast: Secondary | ICD-10-CM

## 2017-12-13 ENCOUNTER — Other Ambulatory Visit: Payer: Self-pay | Admitting: Family Medicine

## 2017-12-13 DIAGNOSIS — Z1231 Encounter for screening mammogram for malignant neoplasm of breast: Secondary | ICD-10-CM

## 2018-01-27 ENCOUNTER — Ambulatory Visit
Admission: RE | Admit: 2018-01-27 | Discharge: 2018-01-27 | Disposition: A | Discharge status: Home / Self Care | Payer: PRIVATE HEALTH INSURANCE | Source: Ambulatory Visit | Attending: Family Medicine | Admitting: Family Medicine

## 2018-01-27 DIAGNOSIS — Z1231 Encounter for screening mammogram for malignant neoplasm of breast: Secondary | ICD-10-CM

## 2018-02-16 ENCOUNTER — Other Ambulatory Visit: Payer: Self-pay | Admitting: Neurosurgery

## 2018-02-16 DIAGNOSIS — M4712 Other spondylosis with myelopathy, cervical region: Secondary | ICD-10-CM

## 2018-02-20 ENCOUNTER — Ambulatory Visit
Admission: RE | Admit: 2018-02-20 | Discharge: 2018-02-20 | Disposition: A | Discharge status: Home / Self Care | Payer: PRIVATE HEALTH INSURANCE | Source: Ambulatory Visit | Attending: Neurosurgery | Admitting: Neurosurgery

## 2018-02-20 DIAGNOSIS — M4712 Other spondylosis with myelopathy, cervical region: Secondary | ICD-10-CM

## 2018-06-27 ENCOUNTER — Other Ambulatory Visit: Payer: Self-pay | Admitting: Neurological Surgery

## 2018-06-27 DIAGNOSIS — R200 Anesthesia of skin: Secondary | ICD-10-CM

## 2018-06-27 DIAGNOSIS — R202 Paresthesia of skin: Secondary | ICD-10-CM

## 2018-07-15 ENCOUNTER — Other Ambulatory Visit: Payer: Self-pay

## 2018-07-15 ENCOUNTER — Ambulatory Visit
Admission: RE | Admit: 2018-07-15 | Discharge: 2018-07-15 | Disposition: A | Discharge status: Home / Self Care | Payer: PRIVATE HEALTH INSURANCE | Source: Ambulatory Visit | Attending: Neurological Surgery | Admitting: Neurological Surgery

## 2018-07-15 DIAGNOSIS — R200 Anesthesia of skin: Secondary | ICD-10-CM

## 2018-07-15 MED ORDER — GADOBENATE DIMEGLUMINE 529 MG/ML IV SOLN
20.0000 mL | Freq: Once | INTRAVENOUS | Status: AC | PRN
  Administered 2018-07-15: 20 mL via INTRAVENOUS

## 2018-11-27 ENCOUNTER — Ambulatory Visit (INDEPENDENT_AMBULATORY_CARE_PROVIDER_SITE_OTHER): Payer: PRIVATE HEALTH INSURANCE

## 2018-11-27 ENCOUNTER — Ambulatory Visit (INDEPENDENT_AMBULATORY_CARE_PROVIDER_SITE_OTHER): Payer: PRIVATE HEALTH INSURANCE | Admitting: Podiatry

## 2018-11-27 ENCOUNTER — Other Ambulatory Visit: Payer: Self-pay

## 2018-11-27 VITALS — BP 142/82 | HR 60

## 2018-11-27 DIAGNOSIS — M778 Other enthesopathies, not elsewhere classified: Secondary | ICD-10-CM | POA: Diagnosis not present

## 2018-11-27 DIAGNOSIS — D3610 Benign neoplasm of peripheral nerves and autonomic nervous system, unspecified: Secondary | ICD-10-CM

## 2018-11-27 DIAGNOSIS — G629 Polyneuropathy, unspecified: Secondary | ICD-10-CM

## 2018-11-27 DIAGNOSIS — G8929 Other chronic pain: Secondary | ICD-10-CM

## 2018-11-27 DIAGNOSIS — M79673 Pain in unspecified foot: Secondary | ICD-10-CM | POA: Diagnosis not present

## 2018-11-27 DIAGNOSIS — R2681 Unsteadiness on feet: Secondary | ICD-10-CM | POA: Diagnosis not present

## 2018-11-27 NOTE — Addendum Note (Signed)
Addended by: Cranford Mon R on: 11/27/2018 04:44 PM   Modules accepted: Orders

## 2018-11-27 NOTE — Patient Instructions (Signed)
Neuropathic Pain Neuropathic pain is pain caused by damage to the nerves that are responsible for certain sensations in your body (sensory nerves). The pain can be caused by:  Damage to the sensory nerves that send signals to your spinal cord and brain (peripheral nervous system).  Damage to the sensory nerves in your brain or spinal cord (central nervous system). Neuropathic pain can make you more sensitive to pain. Even a minor sensation can feel very painful. This is usually a long-term condition that can be difficult to treat. The type of pain differs from person to person. It may:  Start suddenly (acute), or it may develop slowly and last for a long time (chronic).  Come and go as damaged nerves heal, or it may stay at the same level for years.  Cause emotional distress, loss of sleep, and a lower quality of life. What are the causes? The most common cause of this condition is diabetes. Many other diseases and conditions can also cause neuropathic pain. Causes of neuropathic pain can be classified as:  Toxic. This is caused by medicines and chemicals. The most common cause of toxic neuropathic pain is damage from cancer treatments (chemotherapy).  Metabolic. This can be caused by: ? Diabetes. This is the most common disease that damages the nerves. ? Lack of vitamin B from long-term alcohol abuse.  Traumatic. Any injury that cuts, crushes, or stretches a nerve can cause damage and pain. A common example is feeling pain after losing an arm or leg (phantom limb pain).  Compression-related. If a sensory nerve gets trapped or compressed for a long period of time, the blood supply to the nerve can be cut off.  Vascular. Many blood vessel diseases can cause neuropathic pain by decreasing blood supply and oxygen to nerves.  Autoimmune. This type of pain results from diseases in which the body's defense system (immune system) mistakenly attacks sensory nerves. Examples of autoimmune diseases  that can cause neuropathic pain include lupus and multiple sclerosis.  Infectious. Many types of viral infections can damage sensory nerves and cause pain. Shingles infection is a common cause of this type of pain.  Inherited. Neuropathic pain can be a symptom of many diseases that are passed down through families (genetic). What increases the risk? You are more likely to develop this condition if:  You have diabetes.  You smoke.  You drink too much alcohol.  You are taking certain medicines, including medicines that kill cancer cells (chemotherapy) or that treat immune system disorders. What are the signs or symptoms? The main symptom is pain. Neuropathic pain is often described as:  Burning.  Shock-like.  Stinging.  Hot or cold.  Itching. How is this diagnosed? No single test can diagnose neuropathic pain. It is diagnosed based on:  Physical exam and your symptoms. Your health care provider will ask you about your pain. You may be asked to use a pain scale to describe how bad your pain is.  Tests. These may be done to see if you have a high sensitivity to pain and to help find the cause and location of any sensory nerve damage. They include: ? Nerve conduction studies to test how well nerve signals travel through your sensory nerves (electrodiagnostic testing). ? Stimulating your sensory nerves through electrodes on your skin and measuring the response in your spinal cord and brain (somatosensory evoked potential).  Imaging studies, such as: ? X-rays. ? CT scan. ? MRI. How is this treated? Treatment for neuropathic pain may change   over time. You may need to try different treatment options or a combination of treatments. Some options include:  Treating the underlying cause of the neuropathy, such as diabetes, kidney disease, or vitamin deficiencies.  Stopping medicines that can cause neuropathy, such as chemotherapy.  Medicine to relieve pain. Medicines may  include: ? Prescription or over-the-counter pain medicine. ? Anti-seizure medicine. ? Antidepressant medicines. ? Pain-relieving patches that are applied to painful areas of skin. ? A medicine to numb the area (local anesthetic), which can be injected as a nerve block.  Transcutaneous nerve stimulation. This uses electrical currents to block painful nerve signals. The treatment is painless.  Alternative treatments, such as: ? Acupuncture. ? Meditation. ? Massage. ? Physical therapy. ? Pain management programs. ? Counseling. Follow these instructions at home: Medicines   Take over-the-counter and prescription medicines only as told by your health care provider.  Do not drive or use heavy machinery while taking prescription pain medicine.  If you are taking prescription pain medicine, take actions to prevent or treat constipation. Your health care provider may recommend that you: ? Drink enough fluid to keep your urine pale yellow. ? Eat foods that are high in fiber, such as fresh fruits and vegetables, whole grains, and beans. ? Limit foods that are high in fat and processed sugars, such as fried or sweet foods. ? Take an over-the-counter or prescription medicine for constipation. Lifestyle   Have a good support system at home.  Consider joining a chronic pain support group.  Do not use any products that contain nicotine or tobacco, such as cigarettes and e-cigarettes. If you need help quitting, ask your health care provider.  Do not drink alcohol. General instructions  Learn as much as you can about your condition.  Work closely with all your health care providers to find the treatment plan that works best for you.  Ask your health care provider what activities are safe for you.  Keep all follow-up visits as told by your health care provider. This is important. Contact a health care provider if:  Your pain treatments are not working.  You are having side effects  from your medicines.  You are struggling with tiredness (fatigue), mood changes, depression, or anxiety. Summary  Neuropathic pain is pain caused by damage to the nerves that are responsible for certain sensations in your body (sensory nerves).  Neuropathic pain may come and go as damaged nerves heal, or it may stay at the same level for years.  Neuropathic pain is usually a long-term condition that can be difficult to treat. Consider joining a chronic pain support group. This information is not intended to replace advice given to you by your health care provider. Make sure you discuss any questions you have with your health care provider. Document Released: 10/09/2003 Document Revised: 05/04/2018 Document Reviewed: 01/28/2017 Elsevier Patient Education  2020 [employer]  

## 2018-11-27 NOTE — Progress Notes (Signed)
Subjective:   Patient ID: Barbara Kirby, female   DOB: 63 y.o.   MRN: [id number]   HPI 63 year old female presents the office with concerns of bilateral burning, sharp shooting pain to both of her feet.  She states that she has had this for about 10 years and is gradually been getting worse.  She did follow-up with her neurosurgeon, Dr. Ellene Route and had imaging of her back performed which is negative.  Then she went on to have a nerve conduction test which did show polyneuropathy.  She was started on Lyrica which is been very helpful for her but she is concerned that she is having numbness to her foot.  She is prescribed Lyrica 1 pill twice a day but is only taking it once a day as she is fearful of becoming addicted to the medicine.  She states that she has not been told why she has neuropathy.  She also gets pain to both of her feet and she points below and in between the second and third toes.  She states that she was previously had an injection arrhythmia feel better when the injection wore off the midfoot hurt more.   Review of Systems  All other systems reviewed and are negative.  Past Medical History:  Diagnosis Date  . Anemia 2010   h/o "heavy period for months"  . Arthritis   . Complication of anesthesia    "epidural with child birth was too high-hard time breathing"  . Endometriosis   . Family history of adverse reaction to anesthesia    mom was hard to wake up  . Fibroids    uterine  . Fluttering heart    occasional  . GERD (gastroesophageal reflux disease)   . H/O bronchitis   . Hypertension   . Laryngeal spasm 2010   hx of from bronchitis, no problems since  . Positive H. pylori test   . Tachycardia    occasional    Past Surgical History:  Procedure Laterality Date  . BACK SURGERY  01/2011   "lateral lift"  . BREAST BIOPSY Left 07/2013   "metal pin in place, cyst biopsy-benign"  . COLONOSCOPY  2000  . COLONOSCOPY N/A 05/29/2014   Procedure: COLONOSCOPY;  Surgeon:  Rogene Houston, MD;  Location: [facility];  Service: Endoscopy;  Laterality: N/A;  930  . COLONOSCOPY    . DILATION AND CURETTAGE OF UTERUS  2000  . ESOPHAGOGASTRODUODENOSCOPY ENDOSCOPY  2000  . JOINT REPLACEMENT    . LAPAROSCOPY  2000   "female parts- thinking of hysterectomy"  . lateral lift    . TONSILLECTOMY  age 85  . 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 TOTAL HIP ARTHROPLASTY Right 02/12/2015   Procedure: RIGHT TOTAL HIP ARTHROPLASTY ANTERIOR APPROACH;  Surgeon: Gaynelle Arabian, MD;  Location: [facility];  Service: Orthopedics;  Laterality: Right;     Current Outpatient Medications:  .  escitalopram (LEXAPRO) 20 MG tablet, Take by mouth., Disp: , Rfl:  .  omeprazole (PRILOSEC) 20 MG capsule, Take by mouth., Disp: , Rfl:  .  metoprolol succinate (TOPROL-XL) 50 MG 24 hr tablet, Take 50 mg by mouth at bedtime. Take with or immediately following a meal., Disp: , Rfl:  .  pregabalin (LYRICA) 50 MG capsule, Take 50 mg by mouth 2 (two) times daily., Disp: , Rfl:  .  traMADol (ULTRAM) 50 MG tablet, Take 1-2 tablets (50-100 mg total) by mouth every 6 (six) hours as needed (mild pain)., Disp: 80 tablet, Rfl: 1 .  triamterene-hydrochlorothiazide (MAXZIDE-25) 37.5-25 MG per tablet, Take 0.5 tablets by mouth at bedtime., Disp: , Rfl:   Allergies  Allergen Reactions  . Augmentin [Amoxicillin-Pot Clavulanate] Nausea And Vomiting  . Codeine Nausea Only  . Oxycodone Nausea Only          Objective:  Physical Exam  General: AAO x3, NAD  Dermatological: Skin is warm, dry and supple bilateral. Nails x 10 are well manicured; remaining integument appears unremarkable at this time. There are no open sores, no preulcerative lesions, no rash or signs of infection present.  Vascular: Dorsalis Pedis artery and Posterior Tibial artery pedal pulses  are 2/4 bilateral with immedate capillary fill time. There is no pain with calf compression, swelling, warmth, erythema.   Neruologic: Sensation mildly decreased with Semmes Weinstein monofilament, vibratory sensation intact. Negative tinel sign bilaterally.   Musculoskeletal: Tenderness on the third interspaces bilaterally.  No area pinpoint tenderness.  She does describe sharp pain in this area.  Not able to palpate a neuroma but there is discomfort with medial lateral compression of metatarsals.  Muscular strength 5/5 in all groups tested bilateral.  Gait: Unassisted, Nonantalgic.       Assessment:   63 year old female with neuropathy, possible neuroma     Plan:  -Treatment options discussed including all alternatives, risks, and complications -Etiology of symptoms were discussed -X-rays obtained reviewed.  No evidence of acute fracture. -Advised her to start taking Lyrica as prescribed to see if increasing the dose to be helpful.  Also will have her follow-up with her primary care physician regards underlying cause of neuropathy.  Discussed with him possible idiopathic neuropathy.  She is more concerned with the numbness in her feet.  Discussed with her this is difficult with the numbness to resolve.  Will order ultrasound to evaluate for neuromas.  Return in about 3 months (around 02/27/2019).  Or after ultrasounds  Trula Slade DPM   Cc: Dr. Quillian Quince

## 2018-11-28 ENCOUNTER — Telehealth: Payer: Self-pay | Admitting: *Deleted

## 2018-11-28 DIAGNOSIS — D3610 Benign neoplasm of peripheral nerves and autonomic nervous system, unspecified: Secondary | ICD-10-CM

## 2018-11-28 NOTE — Telephone Encounter (Signed)
-----   Message from Trula Slade, DPM sent at 11/27/2018  1:03 PM EST ----- Val- can you please order diagnostic ultrasounds for both feet to evaluate neuroma in the 3rd interspace.   Shelly- can you fax my clinic note to her PCP? Thanks.

## 2018-11-28 NOTE — Telephone Encounter (Signed)
Faxed orders to [facility]. 

## 2018-12-13 ENCOUNTER — Other Ambulatory Visit: Payer: Self-pay | Admitting: Family Medicine

## 2018-12-13 DIAGNOSIS — Z1231 Encounter for screening mammogram for malignant neoplasm of breast: Secondary | ICD-10-CM

## 2019-02-06 ENCOUNTER — Other Ambulatory Visit: Payer: Self-pay

## 2019-02-06 ENCOUNTER — Ambulatory Visit
Admission: RE | Admit: 2019-02-06 | Discharge: 2019-02-06 | Disposition: A | Discharge status: Home / Self Care | Payer: PRIVATE HEALTH INSURANCE | Source: Ambulatory Visit | Attending: Family Medicine | Admitting: Family Medicine

## 2019-02-06 DIAGNOSIS — Z1231 Encounter for screening mammogram for malignant neoplasm of breast: Secondary | ICD-10-CM

## 2019-02-27 ENCOUNTER — Ambulatory Visit: Payer: PRIVATE HEALTH INSURANCE | Admitting: Podiatry

## 2019-07-21 DIAGNOSIS — M79676 Pain in unspecified toe(s): Secondary | ICD-10-CM

## 2020-01-08 ENCOUNTER — Other Ambulatory Visit: Payer: Self-pay | Admitting: Family Medicine

## 2020-01-08 DIAGNOSIS — Z1231 Encounter for screening mammogram for malignant neoplasm of breast: Secondary | ICD-10-CM

## 2020-02-19 ENCOUNTER — Ambulatory Visit
Admission: RE | Admit: 2020-02-19 | Discharge: 2020-02-19 | Disposition: A | Discharge status: Home / Self Care | Payer: PRIVATE HEALTH INSURANCE | Source: Ambulatory Visit | Attending: Family Medicine | Admitting: Family Medicine

## 2020-02-19 ENCOUNTER — Other Ambulatory Visit: Payer: Self-pay

## 2020-02-19 DIAGNOSIS — Z1231 Encounter for screening mammogram for malignant neoplasm of breast: Secondary | ICD-10-CM

## 2020-03-11 ENCOUNTER — Other Ambulatory Visit: Payer: Self-pay | Admitting: Podiatry

## 2020-03-12 ENCOUNTER — Other Ambulatory Visit: Payer: Self-pay | Admitting: Podiatry

## 2020-03-12 DIAGNOSIS — G5762 Lesion of plantar nerve, left lower limb: Secondary | ICD-10-CM

## 2020-03-12 DIAGNOSIS — G5761 Lesion of plantar nerve, right lower limb: Secondary | ICD-10-CM

## 2020-03-12 DIAGNOSIS — R109 Unspecified abdominal pain: Secondary | ICD-10-CM

## 2020-03-24 ENCOUNTER — Ambulatory Visit
Admission: RE | Admit: 2020-03-24 | Discharge: 2020-03-24 | Disposition: A | Discharge status: Home / Self Care | Payer: No Typology Code available for payment source | Source: Ambulatory Visit | Attending: Podiatry | Admitting: Podiatry

## 2020-03-24 ENCOUNTER — Other Ambulatory Visit: Payer: Self-pay

## 2020-03-24 DIAGNOSIS — G5762 Lesion of plantar nerve, left lower limb: Secondary | ICD-10-CM

## 2020-03-24 DIAGNOSIS — G5761 Lesion of plantar nerve, right lower limb: Secondary | ICD-10-CM

## 2020-04-24 ENCOUNTER — Ambulatory Visit
Admission: RE | Admit: 2020-04-24 | Discharge: 2020-04-24 | Disposition: A | Discharge status: Home / Self Care | Payer: No Typology Code available for payment source | Source: Ambulatory Visit | Attending: Podiatry | Admitting: Podiatry

## 2020-06-17 ENCOUNTER — Other Ambulatory Visit: Payer: Self-pay | Admitting: Podiatry

## 2020-06-17 DIAGNOSIS — M79671 Pain in right foot: Secondary | ICD-10-CM

## 2020-06-17 DIAGNOSIS — G5761 Lesion of plantar nerve, right lower limb: Secondary | ICD-10-CM

## 2020-06-17 DIAGNOSIS — M79672 Pain in left foot: Secondary | ICD-10-CM

## 2020-06-17 DIAGNOSIS — G5762 Lesion of plantar nerve, left lower limb: Secondary | ICD-10-CM

## 2020-07-15 ENCOUNTER — Ambulatory Visit
Admission: RE | Admit: 2020-07-15 | Discharge: 2020-07-15 | Disposition: A | Discharge status: Home / Self Care | Payer: No Typology Code available for payment source | Source: Ambulatory Visit | Attending: Podiatry | Admitting: Podiatry

## 2020-07-15 ENCOUNTER — Other Ambulatory Visit: Payer: Self-pay

## 2020-07-15 DIAGNOSIS — M79671 Pain in right foot: Secondary | ICD-10-CM

## 2020-07-15 DIAGNOSIS — G5762 Lesion of plantar nerve, left lower limb: Secondary | ICD-10-CM

## 2020-07-15 DIAGNOSIS — M79672 Pain in left foot: Secondary | ICD-10-CM

## 2020-07-15 DIAGNOSIS — G5761 Lesion of plantar nerve, right lower limb: Secondary | ICD-10-CM

## 2021-02-11 ENCOUNTER — Other Ambulatory Visit: Payer: Self-pay | Admitting: Family Medicine

## 2021-02-11 DIAGNOSIS — Z1231 Encounter for screening mammogram for malignant neoplasm of breast: Secondary | ICD-10-CM

## 2021-02-26 ENCOUNTER — Ambulatory Visit
Admission: RE | Admit: 2021-02-26 | Discharge: 2021-02-26 | Disposition: A | Discharge status: Home / Self Care | Payer: No Typology Code available for payment source | Source: Ambulatory Visit | Attending: Family Medicine | Admitting: Family Medicine

## 2021-02-26 DIAGNOSIS — Z1231 Encounter for screening mammogram for malignant neoplasm of breast: Secondary | ICD-10-CM

## 2021-06-01 IMAGING — MR MRI LUMBAR SPINE WITHOUT AND WITH CONTRAST
4 of 8 series · 25 of 48 positions shown · IV contrast (20ml Multihance)
Comparison: Plain films lumbar spine 10/01/2016.

CLINICAL DATA: Low back pain radiating into both feet with
numbness. Symptoms are chronic and intermittent. History of lumbar
surgery in 1786.

Creatinine was obtained on site at [HOSPITAL] at [HOSPITAL].
Results: Creatinine 0.9 mg/dL.
EXAM:
MRI LUMBAR SPINE WITHOUT AND WITH CONTRAST
TECHNIQUE: Multiplanar and multiecho pulse sequences of the lumbar spine were
obtained without and with intravenous contrast.
CONTRAST:  20 mL MULTIHANCE GADOBENATE DIMEGLUMINE 529 MG/ML IV SOLN

[Series 3: T1 · sagittal · 4.0mm · 0.55mm/px · 5 of 18 slices shown (1 of 2)]
[im 1/18]
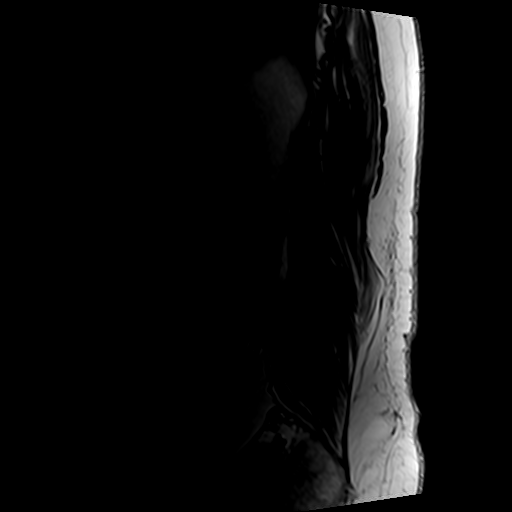
[im 5/18]
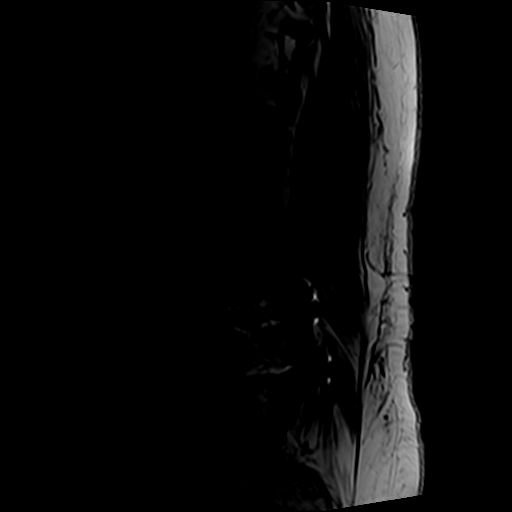
[im 9/18]
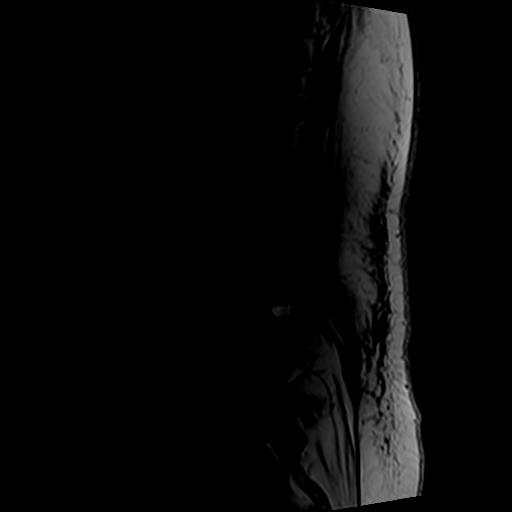
[im 13/18]
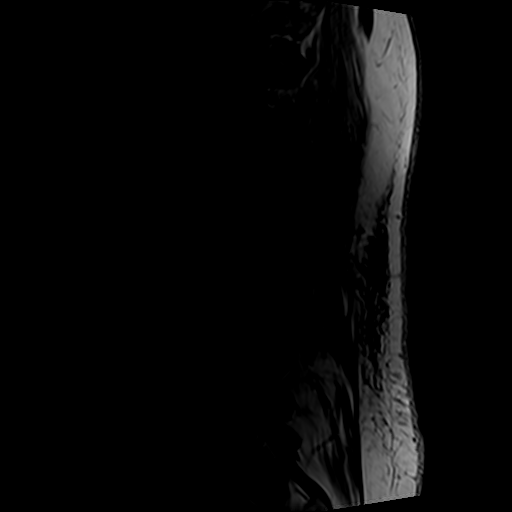
[im 18/18]
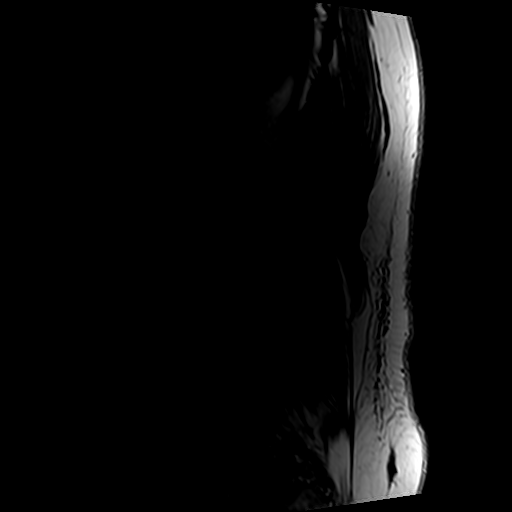

[Series 5: T2 · axial · 4.0mm · 0.78mm/px · z∈[-56,+163]mm · 9 of 38 slices shown (1 of 2)]
[im 1/38]
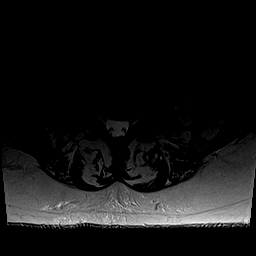
[im 5/38]
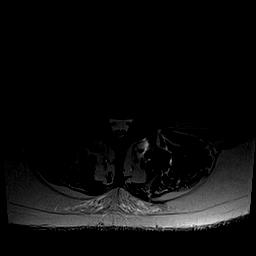
[im 10/38]
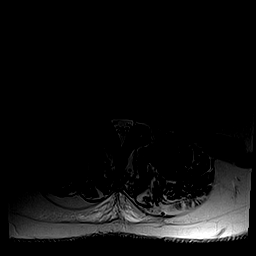
[im 14/38]
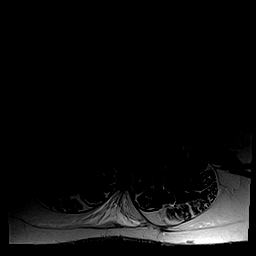
[im 19/38]
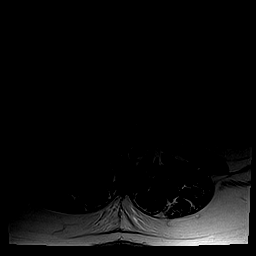
[im 24/38]
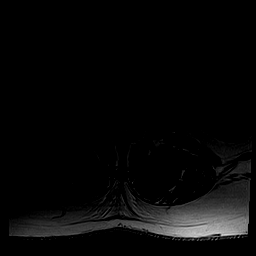
[im 28/38]
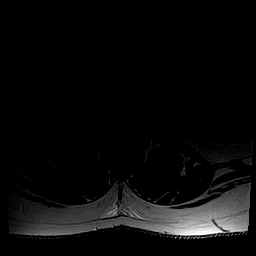
[im 33/38]
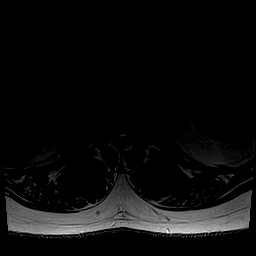
[im 38/38]
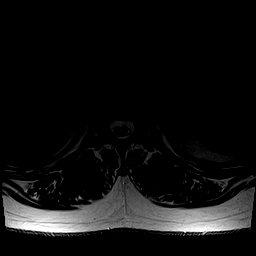

[Series 6: T1 · axial · 4.0mm · 0.39mm/px · z∈[-56,+137]mm · 7 of 38 slices shown (2 of 2)]
[im 1/38]
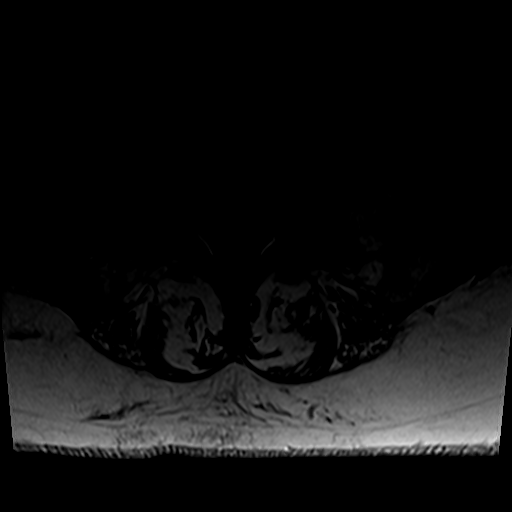
[im 5/38]
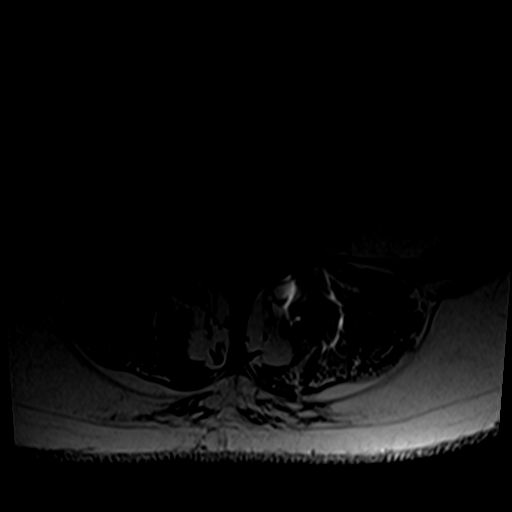
[im 10/38]
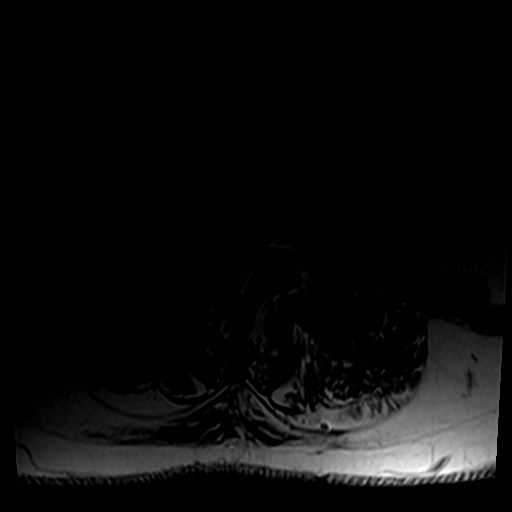
[im 14/38]
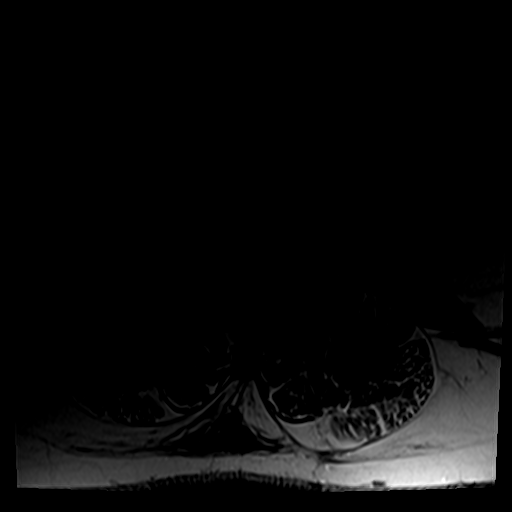
[im 19/38]
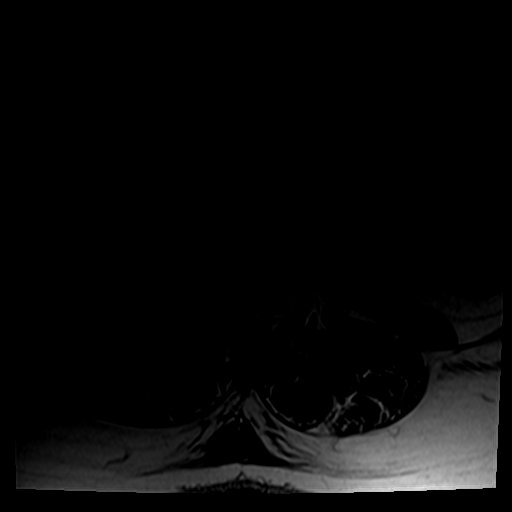
[im 24/38]
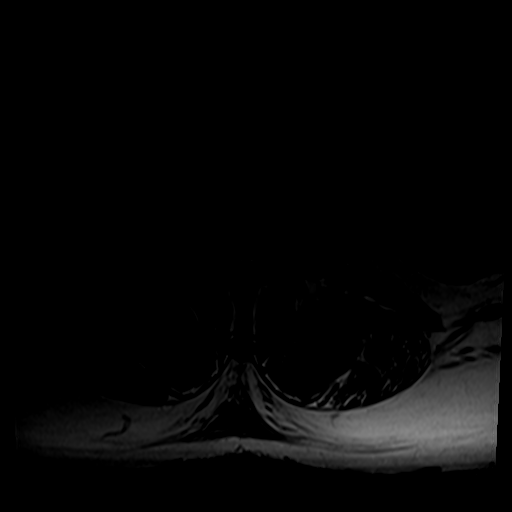
[im 33/38]
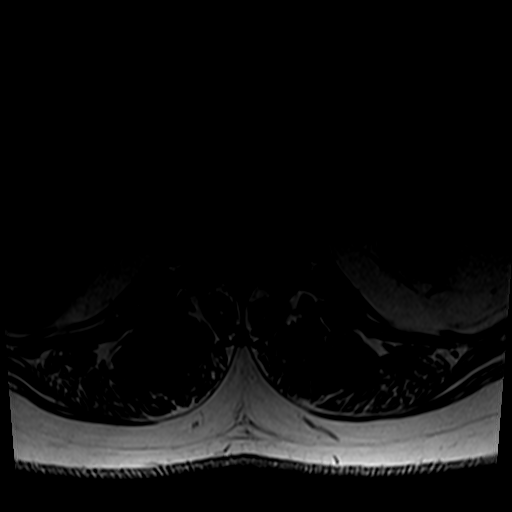

[Series 7: T2 · sagittal · 4.0mm · 0.55mm/px · 4 of 18 slices shown (2 of 2)]
[im 1/18]
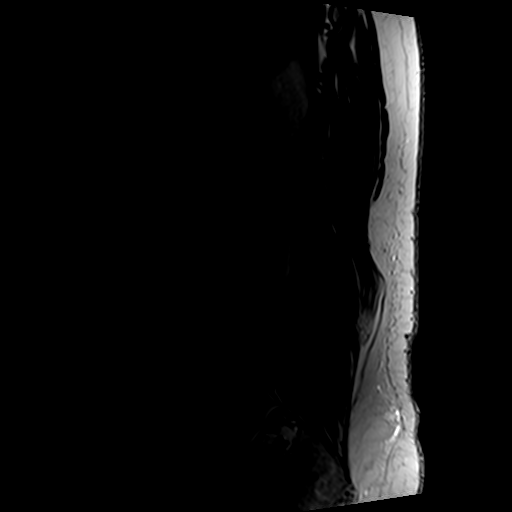
[im 6/18]
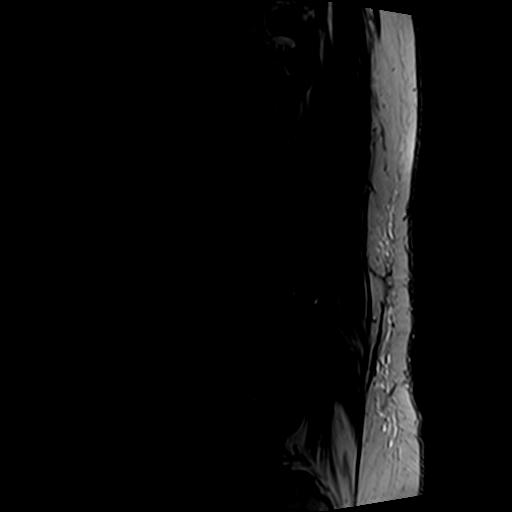
[im 12/18]
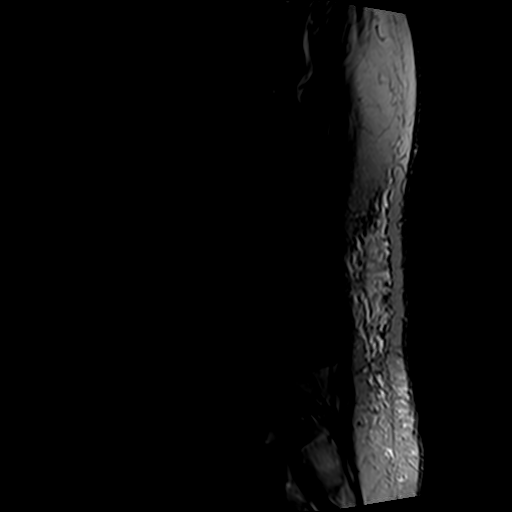
[im 18/18]
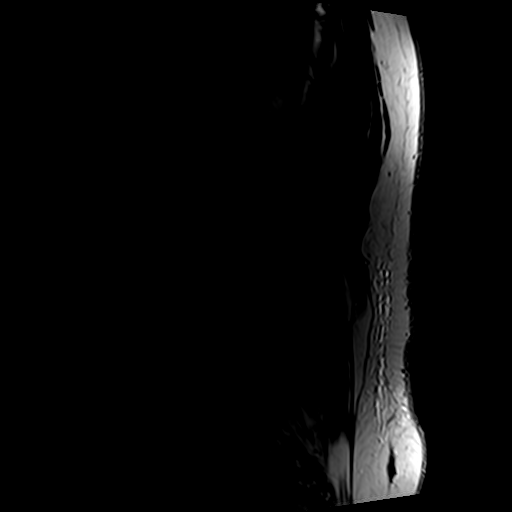

[25 of 48 positions shown; findings below may reference images not displayed]

FINDINGS: Segmentation:  Standard.  Rudimentary disc material S1-2 noted.

Alignment:  Trace retrolisthesis of L5 relative to L4 and S1 noted.

Vertebrae: No fracture or worrisome lesion. The patient is status
post L4-5 fusion. Degenerative endplate signal change L5-S1 noted.

Conus medullaris and cauda equina: Conus extends to the L1-2 level.
Conus and cauda equina appear normal.

Paraspinal and other soft tissues: Negative.

Disc levels:

T11-12 is imaged in the sagittal plane only. There is a shallow
central protrusion but the central canal and foramina appear open.

T12-L1: Tiny central protrusion without stenosis.

L1-2: Minimal disc bulge with some ligamentum flavum thickening and
facet arthropathy. No stenosis.

L2-3: Mild facet degenerative disease.  Otherwise negative.

L3-4: Minimal disc bulge and mild-to-moderate facet degenerative
change. No stenosis.

L4-5: The patient is status post discectomy and fusion with
unilateral pedicle screws and stabilization bars on the left. The
central canal and foramina are open.

L5-S1: There is a shallow disc bulge and mild-to-moderate facet
arthropathy. Minimal narrowing is present in the left subarticular
recess and there is mild bilateral foraminal narrowing.
IMPRESSION: Spondylosis most notable at L5-S1 where there is minimal narrowing
in the left subarticular recess and mild bilateral foraminal
narrowing.

Status post L4-5 discectomy and fusion. The central canal and
foramina are open.

## 2022-02-25 ENCOUNTER — Other Ambulatory Visit: Payer: Self-pay | Admitting: Family Medicine

## 2022-02-25 DIAGNOSIS — Z1231 Encounter for screening mammogram for malignant neoplasm of breast: Secondary | ICD-10-CM

## 2022-03-11 ENCOUNTER — Ambulatory Visit
Admission: RE | Admit: 2022-03-11 | Discharge: 2022-03-11 | Disposition: A | Discharge status: Home / Self Care | Payer: PRIVATE HEALTH INSURANCE | Source: Ambulatory Visit | Attending: Family Medicine | Admitting: Family Medicine

## 2022-03-11 DIAGNOSIS — Z1231 Encounter for screening mammogram for malignant neoplasm of breast: Secondary | ICD-10-CM

## 2022-03-30 DIAGNOSIS — Z961 Presence of intraocular lens: Secondary | ICD-10-CM | POA: Diagnosis not present

## 2022-03-30 DIAGNOSIS — H52222 Regular astigmatism, left eye: Secondary | ICD-10-CM | POA: Diagnosis not present

## 2022-03-30 DIAGNOSIS — T8522XA Displacement of intraocular lens, initial encounter: Secondary | ICD-10-CM | POA: Diagnosis not present

## 2022-05-21 DIAGNOSIS — G629 Polyneuropathy, unspecified: Secondary | ICD-10-CM | POA: Diagnosis not present

## 2022-06-01 DIAGNOSIS — R911 Solitary pulmonary nodule: Secondary | ICD-10-CM | POA: Diagnosis not present

## 2022-06-01 DIAGNOSIS — R918 Other nonspecific abnormal finding of lung field: Secondary | ICD-10-CM | POA: Diagnosis not present

## 2022-06-01 DIAGNOSIS — R222 Localized swelling, mass and lump, trunk: Secondary | ICD-10-CM | POA: Diagnosis not present

## 2022-06-03 DIAGNOSIS — H26492 Other secondary cataract, left eye: Secondary | ICD-10-CM | POA: Diagnosis not present

## 2022-06-18 DIAGNOSIS — R911 Solitary pulmonary nodule: Secondary | ICD-10-CM | POA: Diagnosis not present

## 2022-06-18 DIAGNOSIS — R9389 Abnormal findings on diagnostic imaging of other specified body structures: Secondary | ICD-10-CM | POA: Diagnosis not present

## 2022-06-18 DIAGNOSIS — Z7982 Long term (current) use of aspirin: Secondary | ICD-10-CM | POA: Diagnosis not present

## 2022-06-18 DIAGNOSIS — I10 Essential (primary) hypertension: Secondary | ICD-10-CM | POA: Diagnosis not present

## 2022-06-18 DIAGNOSIS — Z880 Allergy status to penicillin: Secondary | ICD-10-CM | POA: Diagnosis not present

## 2022-06-18 DIAGNOSIS — Z885 Allergy status to narcotic agent status: Secondary | ICD-10-CM | POA: Diagnosis not present

## 2022-06-18 DIAGNOSIS — Z881 Allergy status to other antibiotic agents status: Secondary | ICD-10-CM | POA: Diagnosis not present

## 2022-06-18 DIAGNOSIS — C7801 Secondary malignant neoplasm of right lung: Secondary | ICD-10-CM | POA: Diagnosis not present

## 2022-06-18 DIAGNOSIS — R918 Other nonspecific abnormal finding of lung field: Secondary | ICD-10-CM | POA: Diagnosis not present

## 2022-06-18 DIAGNOSIS — Z923 Personal history of irradiation: Secondary | ICD-10-CM | POA: Diagnosis not present

## 2022-06-18 DIAGNOSIS — Z79899 Other long term (current) drug therapy: Secondary | ICD-10-CM | POA: Diagnosis not present

## 2022-06-25 DIAGNOSIS — C7801 Secondary malignant neoplasm of right lung: Secondary | ICD-10-CM | POA: Diagnosis not present

## 2022-06-26 DIAGNOSIS — I6782 Cerebral ischemia: Secondary | ICD-10-CM | POA: Diagnosis not present

## 2022-06-26 DIAGNOSIS — H539 Unspecified visual disturbance: Secondary | ICD-10-CM | POA: Diagnosis not present

## 2022-06-29 DIAGNOSIS — C7801 Secondary malignant neoplasm of right lung: Secondary | ICD-10-CM | POA: Diagnosis not present

## 2022-07-01 DIAGNOSIS — C7801 Secondary malignant neoplasm of right lung: Secondary | ICD-10-CM | POA: Diagnosis not present

## 2022-07-01 DIAGNOSIS — Z7722 Contact with and (suspected) exposure to environmental tobacco smoke (acute) (chronic): Secondary | ICD-10-CM | POA: Diagnosis not present

## 2022-07-01 DIAGNOSIS — R911 Solitary pulmonary nodule: Secondary | ICD-10-CM | POA: Diagnosis not present

## 2022-07-01 DIAGNOSIS — H539 Unspecified visual disturbance: Secondary | ICD-10-CM | POA: Diagnosis not present

## 2022-07-01 DIAGNOSIS — R0602 Shortness of breath: Secondary | ICD-10-CM | POA: Diagnosis not present

## 2022-07-21 DIAGNOSIS — K7581 Nonalcoholic steatohepatitis (NASH): Secondary | ICD-10-CM | POA: Diagnosis not present

## 2022-07-21 DIAGNOSIS — M545 Low back pain, unspecified: Secondary | ICD-10-CM | POA: Diagnosis not present

## 2022-07-21 DIAGNOSIS — R002 Palpitations: Secondary | ICD-10-CM | POA: Diagnosis not present

## 2022-07-21 DIAGNOSIS — Z01818 Encounter for other preprocedural examination: Secondary | ICD-10-CM | POA: Diagnosis not present

## 2022-07-21 DIAGNOSIS — K2100 Gastro-esophageal reflux disease with esophagitis, without bleeding: Secondary | ICD-10-CM | POA: Diagnosis not present

## 2022-07-21 DIAGNOSIS — E7849 Other hyperlipidemia: Secondary | ICD-10-CM | POA: Diagnosis not present

## 2022-07-21 DIAGNOSIS — E119 Type 2 diabetes mellitus without complications: Secondary | ICD-10-CM | POA: Diagnosis not present

## 2022-07-21 DIAGNOSIS — Z1329 Encounter for screening for other suspected endocrine disorder: Secondary | ICD-10-CM | POA: Diagnosis not present

## 2022-07-21 DIAGNOSIS — D696 Thrombocytopenia, unspecified: Secondary | ICD-10-CM | POA: Diagnosis not present

## 2022-07-21 DIAGNOSIS — I10 Essential (primary) hypertension: Secondary | ICD-10-CM | POA: Diagnosis not present

## 2022-07-21 DIAGNOSIS — E1165 Type 2 diabetes mellitus with hyperglycemia: Secondary | ICD-10-CM | POA: Diagnosis not present

## 2022-07-21 DIAGNOSIS — G252 Other specified forms of tremor: Secondary | ICD-10-CM | POA: Diagnosis not present

## 2022-07-21 DIAGNOSIS — C7801 Secondary malignant neoplasm of right lung: Secondary | ICD-10-CM | POA: Diagnosis not present

## 2022-07-28 DIAGNOSIS — Z170 Estrogen receptor positive status [ER+]: Secondary | ICD-10-CM | POA: Diagnosis not present

## 2022-07-28 DIAGNOSIS — Z981 Arthrodesis status: Secondary | ICD-10-CM | POA: Diagnosis not present

## 2022-07-28 DIAGNOSIS — C7801 Secondary malignant neoplasm of right lung: Secondary | ICD-10-CM | POA: Diagnosis not present

## 2022-07-28 DIAGNOSIS — Z881 Allergy status to other antibiotic agents status: Secondary | ICD-10-CM | POA: Diagnosis not present

## 2022-07-28 DIAGNOSIS — C7800 Secondary malignant neoplasm of unspecified lung: Secondary | ICD-10-CM | POA: Diagnosis not present

## 2022-07-28 DIAGNOSIS — E119 Type 2 diabetes mellitus without complications: Secondary | ICD-10-CM | POA: Diagnosis not present

## 2022-07-28 DIAGNOSIS — Z79899 Other long term (current) drug therapy: Secondary | ICD-10-CM | POA: Diagnosis not present

## 2022-07-28 DIAGNOSIS — I10 Essential (primary) hypertension: Secondary | ICD-10-CM | POA: Diagnosis not present

## 2022-07-28 DIAGNOSIS — Z888 Allergy status to other drugs, medicaments and biological substances status: Secondary | ICD-10-CM | POA: Diagnosis not present

## 2022-07-28 DIAGNOSIS — Z7982 Long term (current) use of aspirin: Secondary | ICD-10-CM | POA: Diagnosis not present

## 2022-07-28 DIAGNOSIS — N183 Chronic kidney disease, stage 3 unspecified: Secondary | ICD-10-CM | POA: Diagnosis not present

## 2022-07-28 DIAGNOSIS — K219 Gastro-esophageal reflux disease without esophagitis: Secondary | ICD-10-CM | POA: Diagnosis not present

## 2022-07-28 DIAGNOSIS — R918 Other nonspecific abnormal finding of lung field: Secondary | ICD-10-CM | POA: Diagnosis not present

## 2022-07-28 DIAGNOSIS — G4733 Obstructive sleep apnea (adult) (pediatric): Secondary | ICD-10-CM | POA: Diagnosis not present

## 2022-07-28 DIAGNOSIS — Z4682 Encounter for fitting and adjustment of non-vascular catheter: Secondary | ICD-10-CM | POA: Diagnosis not present

## 2022-07-28 DIAGNOSIS — Z885 Allergy status to narcotic agent status: Secondary | ICD-10-CM | POA: Diagnosis not present

## 2022-07-28 DIAGNOSIS — I129 Hypertensive chronic kidney disease with stage 1 through stage 4 chronic kidney disease, or unspecified chronic kidney disease: Secondary | ICD-10-CM | POA: Diagnosis not present

## 2022-07-28 DIAGNOSIS — R7303 Prediabetes: Secondary | ICD-10-CM | POA: Diagnosis not present

## 2022-08-23 DIAGNOSIS — E7849 Other hyperlipidemia: Secondary | ICD-10-CM | POA: Diagnosis not present

## 2022-08-23 DIAGNOSIS — E782 Mixed hyperlipidemia: Secondary | ICD-10-CM | POA: Diagnosis not present

## 2022-08-23 DIAGNOSIS — E1165 Type 2 diabetes mellitus with hyperglycemia: Secondary | ICD-10-CM | POA: Diagnosis not present

## 2022-08-23 DIAGNOSIS — I10 Essential (primary) hypertension: Secondary | ICD-10-CM | POA: Diagnosis not present

## 2022-08-27 DIAGNOSIS — I10 Essential (primary) hypertension: Secondary | ICD-10-CM | POA: Diagnosis not present

## 2022-08-27 DIAGNOSIS — R002 Palpitations: Secondary | ICD-10-CM | POA: Diagnosis not present

## 2022-08-27 DIAGNOSIS — D696 Thrombocytopenia, unspecified: Secondary | ICD-10-CM | POA: Diagnosis not present

## 2022-08-27 DIAGNOSIS — M545 Low back pain, unspecified: Secondary | ICD-10-CM | POA: Diagnosis not present

## 2022-08-27 DIAGNOSIS — K7581 Nonalcoholic steatohepatitis (NASH): Secondary | ICD-10-CM | POA: Diagnosis not present

## 2022-08-27 DIAGNOSIS — E1165 Type 2 diabetes mellitus with hyperglycemia: Secondary | ICD-10-CM | POA: Diagnosis not present

## 2022-08-27 DIAGNOSIS — G252 Other specified forms of tremor: Secondary | ICD-10-CM | POA: Diagnosis not present

## 2022-08-27 DIAGNOSIS — E7849 Other hyperlipidemia: Secondary | ICD-10-CM | POA: Diagnosis not present

## 2022-08-27 DIAGNOSIS — Z23 Encounter for immunization: Secondary | ICD-10-CM | POA: Diagnosis not present

## 2022-08-27 DIAGNOSIS — Z0001 Encounter for general adult medical examination with abnormal findings: Secondary | ICD-10-CM | POA: Diagnosis not present

## 2022-08-27 DIAGNOSIS — C7801 Secondary malignant neoplasm of right lung: Secondary | ICD-10-CM | POA: Diagnosis not present

## 2022-08-31 DIAGNOSIS — C7801 Secondary malignant neoplasm of right lung: Secondary | ICD-10-CM | POA: Diagnosis not present

## 2022-09-06 DIAGNOSIS — C7801 Secondary malignant neoplasm of right lung: Secondary | ICD-10-CM | POA: Diagnosis not present

## 2022-09-08 DIAGNOSIS — Z961 Presence of intraocular lens: Secondary | ICD-10-CM | POA: Diagnosis not present

## 2022-09-08 DIAGNOSIS — H2511 Age-related nuclear cataract, right eye: Secondary | ICD-10-CM | POA: Diagnosis not present

## 2022-10-12 DIAGNOSIS — R591 Generalized enlarged lymph nodes: Secondary | ICD-10-CM | POA: Diagnosis not present

## 2022-10-12 DIAGNOSIS — Z79899 Other long term (current) drug therapy: Secondary | ICD-10-CM | POA: Diagnosis not present

## 2022-10-12 DIAGNOSIS — N644 Mastodynia: Secondary | ICD-10-CM | POA: Diagnosis not present

## 2022-10-12 DIAGNOSIS — C7801 Secondary malignant neoplasm of right lung: Secondary | ICD-10-CM | POA: Diagnosis not present

## 2022-10-12 DIAGNOSIS — Z5181 Encounter for therapeutic drug level monitoring: Secondary | ICD-10-CM | POA: Diagnosis not present

## 2022-10-20 ENCOUNTER — Other Ambulatory Visit: Payer: Self-pay | Admitting: Oncology

## 2022-10-20 DIAGNOSIS — C7801 Secondary malignant neoplasm of right lung: Secondary | ICD-10-CM

## 2022-10-26 DIAGNOSIS — H25811 Combined forms of age-related cataract, right eye: Secondary | ICD-10-CM | POA: Diagnosis not present

## 2022-10-26 DIAGNOSIS — H2511 Age-related nuclear cataract, right eye: Secondary | ICD-10-CM | POA: Diagnosis not present

## 2022-11-03 DIAGNOSIS — C7801 Secondary malignant neoplasm of right lung: Secondary | ICD-10-CM | POA: Diagnosis not present

## 2022-11-03 DIAGNOSIS — N644 Mastodynia: Secondary | ICD-10-CM | POA: Diagnosis not present

## 2022-11-23 DIAGNOSIS — C7801 Secondary malignant neoplasm of right lung: Secondary | ICD-10-CM | POA: Diagnosis not present

## 2022-11-23 DIAGNOSIS — Z79811 Long term (current) use of aromatase inhibitors: Secondary | ICD-10-CM | POA: Diagnosis not present

## 2022-11-23 DIAGNOSIS — Z1283 Encounter for screening for malignant neoplasm of skin: Secondary | ICD-10-CM | POA: Diagnosis not present

## 2022-11-23 DIAGNOSIS — Z902 Acquired absence of lung [part of]: Secondary | ICD-10-CM | POA: Diagnosis not present

## 2023-01-06 DIAGNOSIS — H35462 Secondary vitreoretinal degeneration, left eye: Secondary | ICD-10-CM | POA: Diagnosis not present

## 2023-01-06 DIAGNOSIS — H353132 Nonexudative age-related macular degeneration, bilateral, intermediate dry stage: Secondary | ICD-10-CM | POA: Diagnosis not present

## 2023-01-06 DIAGNOSIS — H43813 Vitreous degeneration, bilateral: Secondary | ICD-10-CM | POA: Diagnosis not present

## 2023-01-06 DIAGNOSIS — H43393 Other vitreous opacities, bilateral: Secondary | ICD-10-CM | POA: Diagnosis not present

## 2023-02-22 DIAGNOSIS — C7801 Secondary malignant neoplasm of right lung: Secondary | ICD-10-CM | POA: Diagnosis not present

## 2023-02-22 DIAGNOSIS — Z85118 Personal history of other malignant neoplasm of bronchus and lung: Secondary | ICD-10-CM | POA: Diagnosis not present

## 2023-02-22 DIAGNOSIS — Z5181 Encounter for therapeutic drug level monitoring: Secondary | ICD-10-CM | POA: Diagnosis not present

## 2023-02-22 DIAGNOSIS — M792 Neuralgia and neuritis, unspecified: Secondary | ICD-10-CM | POA: Diagnosis not present

## 2023-02-22 DIAGNOSIS — K760 Fatty (change of) liver, not elsewhere classified: Secondary | ICD-10-CM | POA: Diagnosis not present

## 2023-02-22 DIAGNOSIS — Z79899 Other long term (current) drug therapy: Secondary | ICD-10-CM | POA: Diagnosis not present

## 2023-02-22 DIAGNOSIS — Z08 Encounter for follow-up examination after completed treatment for malignant neoplasm: Secondary | ICD-10-CM | POA: Diagnosis not present

## 2023-02-22 DIAGNOSIS — Z79811 Long term (current) use of aromatase inhibitors: Secondary | ICD-10-CM | POA: Diagnosis not present

## 2023-03-29 DIAGNOSIS — H43392 Other vitreous opacities, left eye: Secondary | ICD-10-CM | POA: Diagnosis not present

## 2023-03-29 DIAGNOSIS — H43812 Vitreous degeneration, left eye: Secondary | ICD-10-CM | POA: Diagnosis not present

## 2023-03-29 DIAGNOSIS — H33312 Horseshoe tear of retina without detachment, left eye: Secondary | ICD-10-CM | POA: Diagnosis not present

## 2023-04-06 DIAGNOSIS — H353132 Nonexudative age-related macular degeneration, bilateral, intermediate dry stage: Secondary | ICD-10-CM | POA: Diagnosis not present

## 2023-04-06 DIAGNOSIS — H31092 Other chorioretinal scars, left eye: Secondary | ICD-10-CM | POA: Diagnosis not present

## 2023-04-24 ENCOUNTER — Encounter (HOSPITAL_BASED_OUTPATIENT_CLINIC_OR_DEPARTMENT_OTHER): Payer: Self-pay

## 2023-04-24 ENCOUNTER — Other Ambulatory Visit: Payer: Self-pay

## 2023-04-24 ENCOUNTER — Emergency Department (HOSPITAL_BASED_OUTPATIENT_CLINIC_OR_DEPARTMENT_OTHER)
Admission: EM | Admit: 2023-04-24 | Discharge: 2023-04-24 | Disposition: A | Discharge status: Home / Self Care | Attending: Emergency Medicine | Admitting: Emergency Medicine

## 2023-04-24 DIAGNOSIS — E1165 Type 2 diabetes mellitus with hyperglycemia: Secondary | ICD-10-CM | POA: Diagnosis not present

## 2023-04-24 DIAGNOSIS — Z79899 Other long term (current) drug therapy: Secondary | ICD-10-CM | POA: Diagnosis not present

## 2023-04-24 DIAGNOSIS — H538 Other visual disturbances: Secondary | ICD-10-CM | POA: Diagnosis not present

## 2023-04-24 DIAGNOSIS — Z8541 Personal history of malignant neoplasm of cervix uteri: Secondary | ICD-10-CM | POA: Diagnosis not present

## 2023-04-24 DIAGNOSIS — H5509 Other forms of nystagmus: Secondary | ICD-10-CM | POA: Insufficient documentation

## 2023-04-24 DIAGNOSIS — I10 Essential (primary) hypertension: Secondary | ICD-10-CM | POA: Insufficient documentation

## 2023-04-24 DIAGNOSIS — R42 Dizziness and giddiness: Secondary | ICD-10-CM | POA: Diagnosis not present

## 2023-04-24 LAB — CBC
HCT: 44.7 % (ref 36.0–46.0)
Hemoglobin: 15.9 g/dL — ABNORMAL HIGH (ref 12.0–15.0)
MCH: 32.2 pg (ref 26.0–34.0)
MCHC: 35.6 g/dL (ref 30.0–36.0)
MCV: 90.5 fL (ref 80.0–100.0)
Platelets: 229 10*3/uL (ref 150–400)
RBC: 4.94 MIL/uL (ref 3.87–5.11)
RDW: 12.6 % (ref 11.5–15.5)
WBC: 8.4 10*3/uL (ref 4.0–10.5)
nRBC: 0 % (ref 0.0–0.2)

## 2023-04-24 LAB — CBG MONITORING, ED: Glucose-Capillary: 177 mg/dL — ABNORMAL HIGH (ref 70–99)

## 2023-04-24 LAB — URINALYSIS, W/ REFLEX TO CULTURE (INFECTION SUSPECTED)
Bilirubin Urine: NEGATIVE
Glucose, UA: NEGATIVE mg/dL
Hgb urine dipstick: NEGATIVE
Ketones, ur: NEGATIVE mg/dL
Nitrite: NEGATIVE
Protein, ur: NEGATIVE mg/dL
Specific Gravity, Urine: 1.013 (ref 1.005–1.030)
pH: 5 (ref 5.0–8.0)

## 2023-04-24 LAB — BASIC METABOLIC PANEL WITH GFR
Anion gap: 13 (ref 5–15)
BUN: 17 mg/dL (ref 8–23)
CO2: 23 mmol/L (ref 22–32)
Calcium: 9.4 mg/dL (ref 8.9–10.3)
Chloride: 97 mmol/L — ABNORMAL LOW (ref 98–111)
Creatinine, Ser: 0.86 mg/dL (ref 0.44–1.00)
GFR, Estimated: >60 mL/min (ref >60)
Glucose, Bld: 184 mg/dL — ABNORMAL HIGH (ref 70–99)
Potassium: 3.7 mmol/L (ref 3.5–5.1)
Sodium: 133 mmol/L — ABNORMAL LOW (ref 135–145)

## 2023-04-24 MED ORDER — SODIUM CHLORIDE 0.9 % IV BOLUS
500.0000 mL | Freq: Once | INTRAVENOUS | Status: AC
  Administered 2023-04-24: 500 mL via INTRAVENOUS

## 2023-04-24 MED ORDER — MECLIZINE HCL 25 MG PO TABS: 25.0000 mg | ORAL_TABLET | Freq: Three times a day (TID) | ORAL | 0 refills | Status: AC | PRN

## 2023-04-24 MED ORDER — ONDANSETRON 4 MG PO TBDP: ORAL_TABLET | ORAL | 0 refills | Status: AC

## 2023-04-24 MED ORDER — ONDANSETRON HCL 4 MG/2ML IJ SOLN
4.0000 mg | Freq: Once | INTRAMUSCULAR | Status: AC
  Administered 2023-04-24: 4 mg via INTRAVENOUS
  Filled 2023-04-24: qty 2

## 2023-04-24 MED ORDER — MECLIZINE HCL 25 MG PO TABS
25.0000 mg | ORAL_TABLET | Freq: Once | ORAL | Status: AC
  Administered 2023-04-24: 25 mg via ORAL
  Filled 2023-04-24: qty 1

## 2023-04-24 NOTE — ED Provider Notes (Signed)
 [place] EMERGENCY DEPARTMENT AT [facility] Provider Note   CSN: [id number] Arrival date & time: 04/24/23  1510     History  Chief Complaint  Patient presents with   Dizziness   Blurred Vision    Barbara Kirby is a 68 y.o. female.  Patient is a 68 year old female with a history of hypertension, hyperlipidemia, diabetes, uterine cancer who presents with dizziness.  She states she was driving home from eating lunch and had a sudden onset of dizziness.  She said she had looked out the window to the right and then when she looked back she felt like her head was "swimming".  She felt like her eyes were jerking around.  She had some nausea and associated vomiting.  She felt like it was worse if she moves her head or looked from side-to-side.  She said her symptoms improved by the time she got here but she still feels a little bit dizzy.  Her eye jerking has resolved.  She denies any other vision changes or blind spots.  No difficulty with her speech.  Her husband said her eyes looked like they were normal.  She denies any numbness or weakness to her extremities.  She has had a history of vertigo in the past although it was many years ago.  She denies any headache.  No chest pain or shortness of breath.  No palpitations.  No recent fevers or other illnesses.       Home Medications Prior to Admission medications   Medication Sig Start Date End Date Taking? Authorizing Provider  meclizine (ANTIVERT) 25 MG tablet Take 1 tablet (25 mg total) by mouth 3 (three) times daily as needed for dizziness. 04/24/23  Yes Rolan Bucco, MD  ondansetron (ZOFRAN-ODT) 4 MG disintegrating tablet 4mg  ODT q4 hours prn nausea/vomit 04/24/23  Yes Damarkus Balis, Shawna Orleans, MD  escitalopram (LEXAPRO) 20 MG tablet Take by mouth. 12/20/16   [provider]  metoprolol succinate (TOPROL-XL) 50 MG 24 hr tablet Take 50 mg by mouth at bedtime. Take with or immediately following a meal.    [provider]  omeprazole (PRILOSEC) 20 MG capsule Take by mouth. 09/15/09   [provider]  pregabalin (LYRICA) 50 MG capsule Take 50 mg by mouth 2 (two) times daily. 11/02/18   [provider]  traMADol (ULTRAM) 50 MG tablet Take 1-2 tablets (50-100 mg total) by mouth every 6 (six) hours as needed (mild pain). 02/13/15   Perkins, Alexzandrew L, PA-C  triamterene-hydrochlorothiazide (MAXZIDE-25) 37.5-25 MG per tablet Take 0.5 tablets by mouth at bedtime.    [provider]      Allergies    Augmentin [amoxicillin-pot clavulanate], Codeine, and Oxycodone    Review of Systems   Review of Systems  Constitutional:  Negative for chills, diaphoresis, fatigue and fever.  HENT:  Negative for congestion, rhinorrhea and sneezing.   Eyes:  Positive for visual disturbance (Felt like her eyes were "jerking"). Negative for pain and discharge.  Respiratory:  Negative for cough, chest tightness and shortness of breath.   Cardiovascular:  Negative for chest pain and leg swelling.  Gastrointestinal:  Positive for nausea and vomiting. Negative for abdominal pain, blood in stool and diarrhea.  Genitourinary:  Negative for difficulty urinating, flank pain, frequency and hematuria.  Musculoskeletal:  Negative for arthralgias and back pain.  Skin:  Negative for rash.  Neurological:  Positive for dizziness. Negative for speech difficulty, weakness, numbness and headaches.    Physical Exam Updated Vital Signs  BP (!) 151/75 (BP Location: Right Arm)   Pulse (!) 59   Temp 97.8 F (36.6 C) (Oral)   Resp 17   Ht 5\' 8"  (1.727 m)   Wt 111.1 kg   SpO2 97%   BMI 37.25 kg/m  Physical Exam Constitutional:      Appearance: She is well-developed.  HENT:     Head: Normocephalic and atraumatic.  Eyes:     Pupils: Pupils are equal, round, and reactive to light.     Comments: Horizontal nystagmus with fast component to the left.  No vertical or rotational nystagmus.  Cardiovascular:     Rate and  Rhythm: Normal rate and regular rhythm.     Heart sounds: Normal heart sounds.  Pulmonary:     Effort: Pulmonary effort is normal. No respiratory distress.     Breath sounds: Normal breath sounds. No wheezing or rales.  Chest:     Chest wall: No tenderness.  Abdominal:     General: Bowel sounds are normal.     Palpations: Abdomen is soft.     Tenderness: There is no abdominal tenderness. There is no guarding or rebound.  Musculoskeletal:        General: Normal range of motion.     Cervical back: Normal range of motion and neck supple.  Lymphadenopathy:     Cervical: No cervical adenopathy.  Skin:    General: Skin is warm and dry.     Findings: No rash.  Neurological:     Mental Status: She is alert and oriented to person, place, and time.     Comments: Motor 5/5 all extremities Sensation grossly intact to LT all extremities Finger to Nose intact, no pronator drift CN II-XII grossly intact       ED Results / Procedures / Treatments   Labs (all labs ordered are listed, but only abnormal results are displayed) Labs Reviewed  BASIC METABOLIC PANEL WITH GFR - Abnormal; Notable for the following components:      Result Value   Sodium 133 (*)    Chloride 97 (*)    Glucose, Bld 184 (*)    All other components within normal limits  CBC - Abnormal; Notable for the following components:   Hemoglobin 15.9 (*)    All other components within normal limits  URINALYSIS, W/ REFLEX TO CULTURE (INFECTION SUSPECTED) - Abnormal; Notable for the following components:   Leukocytes,Ua SMALL (*)    Bacteria, UA RARE (*)    All other components within normal limits  CBG MONITORING, ED - Abnormal; Notable for the following components:   Glucose-Capillary 177 (*)    All other components within normal limits  CBG MONITORING, ED    EKG EKG Interpretation Date/Time:  Sunday April 24 2023 15:19:50 EDT Ventricular Rate:  76 PR Interval:  160 QRS Duration:  101 QT Interval:  403 QTC  Calculation: 454 R Axis:   -42  Text Interpretation: Sinus rhythm Inferior infarct, 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  Procedures Procedures    Medications Ordered in ED Medications  sodium chloride 0.9 % bolus 500 mL (0 mLs Intravenous Stopped 04/24/23 1758)  ondansetron (ZOFRAN) injection 4 mg (4 mg Intravenous Given 04/24/23 1626)  meclizine (ANTIVERT) tablet 25 mg (25 mg Oral Given 04/24/23 1630)    ED Course/ Medical Decision Making/ A&P  Medical Decision Making Amount and/or Complexity of Data Reviewed Labs: ordered.  Risk Prescription drug management.   Patient is a 68 year old female who presents with dizziness.  Her symptoms sound vertiginous in nature.  She does not have associated headache.  She does not have any other neurologic deficits.  She was given meclizine and Zofran as well as some IV fluids and her symptoms have markedly improved.  She is able to ambulate without any ataxia or ongoing dizziness.  She does not have other symptoms that would be more concerning for central etiology of her vertigo such as stroke or mass.  Labs reviewed and are nonconcerning.  Her glucose is elevated.  She has been noncompliant with her metformin because she did not like the side effects.  I advised her that she needs to discuss this with her primary care doctor and that there are other medication choices that she could pursue.  She does not have headache, ataxia or other symptoms that would be more concerning for brain metastases from her prior uterine cancer.  However I did advise patient to follow-up with her primary care doctor and/or oncologist.  If she has any ongoing symptoms, may need to get an MRI.  I do not see an indication to transfer her for an emergent MRI today.  She was  discharged home in good condition.  She was given prescriptions for meclizine and Zofran.  Return precautions were given.  Final Clinical Impression(s) / ED Diagnoses Final diagnoses:  Vertigo    Rx / DC Orders ED Discharge Orders          Ordered    meclizine (ANTIVERT) 25 MG tablet  3 times daily PRN        04/24/23 1839    ondansetron (ZOFRAN-ODT) 4 MG disintegrating tablet        04/24/23 1839              Rolan Bucco, MD 04/24/23 1842

## 2023-04-24 NOTE — Discharge Instructions (Addendum)
 Follow-up with your primary care doctor or oncologist to make sure that the dizziness has resolved.  Your glucose was elevated.  Please discuss this with your primary care doctor.  Return to the emergency room if you have any worsening symptoms.

## 2023-04-24 NOTE — ED Triage Notes (Signed)
 Patient arrives with complaints of sudden onset of dizziness and blurred vision that started suddenly ~45 minutes prior to arrival. Patient vomiting on arrival as well. No pain. Alert and oriented x4.  Dr. Fredderick Phenix notified of patient status.

## 2023-05-10 DIAGNOSIS — L821 Other seborrheic keratosis: Secondary | ICD-10-CM | POA: Diagnosis not present

## 2023-05-10 DIAGNOSIS — L570 Actinic keratosis: Secondary | ICD-10-CM | POA: Diagnosis not present

## 2023-05-24 DIAGNOSIS — Z1231 Encounter for screening mammogram for malignant neoplasm of breast: Secondary | ICD-10-CM | POA: Diagnosis not present

## 2023-05-24 DIAGNOSIS — N644 Mastodynia: Secondary | ICD-10-CM | POA: Diagnosis not present

## 2023-05-24 DIAGNOSIS — R92323 Mammographic fibroglandular density, bilateral breasts: Secondary | ICD-10-CM | POA: Diagnosis not present

## 2023-05-24 DIAGNOSIS — M898X9 Other specified disorders of bone, unspecified site: Secondary | ICD-10-CM | POA: Diagnosis not present

## 2023-05-24 DIAGNOSIS — Z79811 Long term (current) use of aromatase inhibitors: Secondary | ICD-10-CM | POA: Diagnosis not present

## 2023-05-24 DIAGNOSIS — Z08 Encounter for follow-up examination after completed treatment for malignant neoplasm: Secondary | ICD-10-CM | POA: Diagnosis not present

## 2023-05-24 DIAGNOSIS — R5383 Other fatigue: Secondary | ICD-10-CM | POA: Diagnosis not present

## 2023-05-24 DIAGNOSIS — E1140 Type 2 diabetes mellitus with diabetic neuropathy, unspecified: Secondary | ICD-10-CM | POA: Diagnosis not present

## 2023-05-24 DIAGNOSIS — C7801 Secondary malignant neoplasm of right lung: Secondary | ICD-10-CM | POA: Diagnosis not present

## 2023-05-24 DIAGNOSIS — R42 Dizziness and giddiness: Secondary | ICD-10-CM | POA: Diagnosis not present

## 2023-06-09 DIAGNOSIS — H353132 Nonexudative age-related macular degeneration, bilateral, intermediate dry stage: Secondary | ICD-10-CM | POA: Diagnosis not present

## 2023-06-09 DIAGNOSIS — H26491 Other secondary cataract, right eye: Secondary | ICD-10-CM | POA: Diagnosis not present

## 2023-06-09 DIAGNOSIS — H31092 Other chorioretinal scars, left eye: Secondary | ICD-10-CM | POA: Diagnosis not present

## 2023-06-09 DIAGNOSIS — H43392 Other vitreous opacities, left eye: Secondary | ICD-10-CM | POA: Diagnosis not present

## 2023-06-14 DIAGNOSIS — E1140 Type 2 diabetes mellitus with diabetic neuropathy, unspecified: Secondary | ICD-10-CM | POA: Diagnosis not present

## 2023-06-14 DIAGNOSIS — M79672 Pain in left foot: Secondary | ICD-10-CM | POA: Diagnosis not present

## 2023-06-14 DIAGNOSIS — M792 Neuralgia and neuritis, unspecified: Secondary | ICD-10-CM | POA: Diagnosis not present

## 2023-06-14 DIAGNOSIS — R300 Dysuria: Secondary | ICD-10-CM | POA: Diagnosis not present

## 2023-06-14 DIAGNOSIS — M79671 Pain in right foot: Secondary | ICD-10-CM | POA: Diagnosis not present

## 2023-07-07 DIAGNOSIS — M792 Neuralgia and neuritis, unspecified: Secondary | ICD-10-CM | POA: Diagnosis not present

## 2023-07-07 DIAGNOSIS — E1140 Type 2 diabetes mellitus with diabetic neuropathy, unspecified: Secondary | ICD-10-CM | POA: Diagnosis not present

## 2023-07-07 DIAGNOSIS — M79671 Pain in right foot: Secondary | ICD-10-CM | POA: Diagnosis not present

## 2023-07-07 DIAGNOSIS — M79672 Pain in left foot: Secondary | ICD-10-CM | POA: Diagnosis not present

## 2023-07-21 DIAGNOSIS — R0602 Shortness of breath: Secondary | ICD-10-CM | POA: Diagnosis not present

## 2023-07-21 DIAGNOSIS — R002 Palpitations: Secondary | ICD-10-CM | POA: Diagnosis not present

## 2023-07-25 DIAGNOSIS — E7849 Other hyperlipidemia: Secondary | ICD-10-CM | POA: Diagnosis not present

## 2023-07-25 DIAGNOSIS — E1165 Type 2 diabetes mellitus with hyperglycemia: Secondary | ICD-10-CM | POA: Diagnosis not present

## 2023-07-25 DIAGNOSIS — R0602 Shortness of breath: Secondary | ICD-10-CM | POA: Diagnosis not present

## 2023-07-25 DIAGNOSIS — I10 Essential (primary) hypertension: Secondary | ICD-10-CM | POA: Diagnosis not present

## 2023-07-25 DIAGNOSIS — Z1329 Encounter for screening for other suspected endocrine disorder: Secondary | ICD-10-CM | POA: Diagnosis not present

## 2023-08-10 DIAGNOSIS — M792 Neuralgia and neuritis, unspecified: Secondary | ICD-10-CM | POA: Diagnosis not present

## 2023-08-10 DIAGNOSIS — M79672 Pain in left foot: Secondary | ICD-10-CM | POA: Diagnosis not present

## 2023-08-10 DIAGNOSIS — E1140 Type 2 diabetes mellitus with diabetic neuropathy, unspecified: Secondary | ICD-10-CM | POA: Diagnosis not present

## 2023-08-10 DIAGNOSIS — M79671 Pain in right foot: Secondary | ICD-10-CM | POA: Diagnosis not present

## 2023-08-15 DIAGNOSIS — E7849 Other hyperlipidemia: Secondary | ICD-10-CM | POA: Diagnosis not present

## 2023-08-15 DIAGNOSIS — E1165 Type 2 diabetes mellitus with hyperglycemia: Secondary | ICD-10-CM | POA: Diagnosis not present

## 2023-08-15 DIAGNOSIS — K219 Gastro-esophageal reflux disease without esophagitis: Secondary | ICD-10-CM | POA: Diagnosis not present

## 2023-08-15 DIAGNOSIS — I10 Essential (primary) hypertension: Secondary | ICD-10-CM | POA: Diagnosis not present

## 2023-08-15 DIAGNOSIS — E559 Vitamin D deficiency, unspecified: Secondary | ICD-10-CM | POA: Diagnosis not present

## 2023-08-18 DIAGNOSIS — E1165 Type 2 diabetes mellitus with hyperglycemia: Secondary | ICD-10-CM | POA: Diagnosis not present

## 2023-08-18 DIAGNOSIS — E7849 Other hyperlipidemia: Secondary | ICD-10-CM | POA: Diagnosis not present

## 2023-08-18 DIAGNOSIS — I10 Essential (primary) hypertension: Secondary | ICD-10-CM | POA: Diagnosis not present

## 2023-08-18 DIAGNOSIS — E782 Mixed hyperlipidemia: Secondary | ICD-10-CM | POA: Diagnosis not present

## 2023-08-23 DIAGNOSIS — R0602 Shortness of breath: Secondary | ICD-10-CM | POA: Diagnosis not present

## 2023-08-23 DIAGNOSIS — C7801 Secondary malignant neoplasm of right lung: Secondary | ICD-10-CM | POA: Diagnosis not present

## 2023-08-23 DIAGNOSIS — I10 Essential (primary) hypertension: Secondary | ICD-10-CM | POA: Diagnosis not present

## 2023-08-23 DIAGNOSIS — Z79811 Long term (current) use of aromatase inhibitors: Secondary | ICD-10-CM | POA: Diagnosis not present

## 2023-08-24 DIAGNOSIS — I351 Nonrheumatic aortic (valve) insufficiency: Secondary | ICD-10-CM | POA: Diagnosis not present

## 2023-08-24 DIAGNOSIS — C7801 Secondary malignant neoplasm of right lung: Secondary | ICD-10-CM | POA: Diagnosis not present

## 2023-08-24 DIAGNOSIS — I119 Hypertensive heart disease without heart failure: Secondary | ICD-10-CM | POA: Diagnosis not present

## 2023-08-24 DIAGNOSIS — R0602 Shortness of breath: Secondary | ICD-10-CM | POA: Diagnosis not present

## 2023-09-07 DIAGNOSIS — E1140 Type 2 diabetes mellitus with diabetic neuropathy, unspecified: Secondary | ICD-10-CM | POA: Diagnosis not present

## 2023-09-07 DIAGNOSIS — M79671 Pain in right foot: Secondary | ICD-10-CM | POA: Diagnosis not present

## 2023-09-07 DIAGNOSIS — M792 Neuralgia and neuritis, unspecified: Secondary | ICD-10-CM | POA: Diagnosis not present

## 2023-09-07 DIAGNOSIS — M79672 Pain in left foot: Secondary | ICD-10-CM | POA: Diagnosis not present

## 2023-09-15 DIAGNOSIS — E119 Type 2 diabetes mellitus without complications: Secondary | ICD-10-CM | POA: Diagnosis not present

## 2023-10-11 DIAGNOSIS — M79672 Pain in left foot: Secondary | ICD-10-CM | POA: Diagnosis not present

## 2023-10-11 DIAGNOSIS — M79671 Pain in right foot: Secondary | ICD-10-CM | POA: Diagnosis not present

## 2023-10-11 DIAGNOSIS — M792 Neuralgia and neuritis, unspecified: Secondary | ICD-10-CM | POA: Diagnosis not present

## 2023-11-08 DIAGNOSIS — M2550 Pain in unspecified joint: Secondary | ICD-10-CM | POA: Diagnosis not present

## 2023-11-21 DIAGNOSIS — R300 Dysuria: Secondary | ICD-10-CM | POA: Diagnosis not present

## 2023-11-21 DIAGNOSIS — R0602 Shortness of breath: Secondary | ICD-10-CM | POA: Diagnosis not present
